# Patient Record
Sex: Male | Born: 1960
Health system: Southern US, Community
[De-identification: ages and names within clinical notes are randomized; demographics above are authoritative.]

## PROBLEM LIST (undated history)

## (undated) DIAGNOSIS — E785 Hyperlipidemia, unspecified: Secondary | ICD-10-CM

## (undated) DIAGNOSIS — K224 Dyskinesia of esophagus: Secondary | ICD-10-CM

## (undated) DIAGNOSIS — K219 Gastro-esophageal reflux disease without esophagitis: Secondary | ICD-10-CM

## (undated) DIAGNOSIS — I10 Essential (primary) hypertension: Secondary | ICD-10-CM

## (undated) DIAGNOSIS — S62502A Fracture of unspecified phalanx of left thumb, initial encounter for closed fracture: Secondary | ICD-10-CM

## (undated) DIAGNOSIS — Z87898 Personal history of other specified conditions: Secondary | ICD-10-CM

## (undated) HISTORY — DX: Dyskinesia of esophagus: K22.4

## (undated) HISTORY — DX: Gastro-esophageal reflux disease without esophagitis: K21.9

## (undated) HISTORY — DX: Fracture of unspecified phalanx of left thumb, initial encounter for closed fracture: S62.502A

## (undated) HISTORY — DX: Personal history of other specified conditions: Z87.898

## (undated) HISTORY — DX: Hyperlipidemia, unspecified: E78.5

## (undated) HISTORY — DX: Essential (primary) hypertension: I10

---

## 2004-12-02 DIAGNOSIS — S62502A Fracture of unspecified phalanx of left thumb, initial encounter for closed fracture: Secondary | ICD-10-CM

## 2004-12-02 HISTORY — DX: Fracture of unspecified phalanx of left thumb, initial encounter for closed fracture: S62.502A

## 2005-05-14 ENCOUNTER — Emergency Department (HOSPITAL_COMMUNITY): Admission: EM | Admit: 2005-05-14 | Discharge: 2005-05-15 | Payer: Self-pay | Admitting: Emergency Medicine

## 2006-06-12 ENCOUNTER — Encounter (INDEPENDENT_AMBULATORY_CARE_PROVIDER_SITE_OTHER): Payer: Self-pay | Admitting: Cardiology

## 2006-06-12 ENCOUNTER — Ambulatory Visit (HOSPITAL_COMMUNITY): Admission: RE | Admit: 2006-06-12 | Discharge: 2006-06-12 | Payer: Self-pay | Admitting: Cardiovascular Disease

## 2006-12-20 ENCOUNTER — Emergency Department (HOSPITAL_COMMUNITY): Admission: EM | Admit: 2006-12-20 | Discharge: 2006-12-20 | Payer: Self-pay | Admitting: Emergency Medicine

## 2006-12-23 ENCOUNTER — Emergency Department (HOSPITAL_COMMUNITY): Admission: EM | Admit: 2006-12-23 | Discharge: 2006-12-23 | Payer: Self-pay | Admitting: *Deleted

## 2007-04-20 ENCOUNTER — Encounter: Admission: RE | Admit: 2007-04-20 | Discharge: 2007-04-20 | Payer: Self-pay | Admitting: Gastroenterology

## 2007-05-18 ENCOUNTER — Ambulatory Visit (HOSPITAL_COMMUNITY): Admission: RE | Admit: 2007-05-18 | Discharge: 2007-05-18 | Payer: Self-pay | Admitting: Gastroenterology

## 2008-08-04 HISTORY — PX: HIATAL HERNIA REPAIR: SHX195

## 2010-11-20 ENCOUNTER — Ambulatory Visit (INDEPENDENT_AMBULATORY_CARE_PROVIDER_SITE_OTHER): Payer: 59 | Admitting: Internal Medicine

## 2010-11-20 ENCOUNTER — Encounter: Payer: Self-pay | Admitting: Internal Medicine

## 2010-11-20 VITALS — BP 156/96 | HR 78 | Temp 98.6°F | Ht 68.5 in | Wt 148.0 lb

## 2010-11-20 DIAGNOSIS — Z0000 Encounter for general adult medical examination without abnormal findings: Secondary | ICD-10-CM

## 2010-11-20 NOTE — Progress Notes (Signed)
Subjective:    Patient ID: Matthew Roberts, male    DOB: Dec 06, 1960, 50 y.o.   MRN: [id number]  HPIMr. Trudo presents to establish for on-going continuity care. He has no active complaints or problems at this time. He feels he is in good health.  Past Medical History  Diagnosis Date  . GERD (gastroesophageal reflux disease)   . History of epistaxis     required cauterization '08  . Fracture of thumb, left, closed may '06  . Esophageal dysmotility     Dx barium swallow.    Past Surgical History  Procedure Date  . Hiatal hernia repair    Family History  Problem Relation Age of Onset  . Dementia Mother   . Cancer Neg Hx   . COPD Neg Hx   . Drug abuse Neg Hx   . Diabetes Neg Hx   . Hyperlipidemia Neg Hx   . Hypertension Neg Hx   . Heart disease Neg Hx    History   Social History  . Marital Status: Single    Spouse Name: N/A    Number of Children: N/A  . Years of Education: 12   Occupational History  . floor tech [employer]   Social History Main Topics  . Smoking status: Current Everyday Smoker -- 0.5 packs/day for 30 years    Types: Cigarettes  . Smokeless tobacco: Never Used   Comment: 10 cigs per day  . Alcohol Use: 3.5 oz/week    7 drink(s) per week  . Drug Use: No  . Sexually Active: Yes -- Male partner(s)   Other Topics Concern  . Not on file   Social History Narrative   [employer]. 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 Married - '82 - 70yrs/divorced. No children. Serially monogamous. 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 No pets.         Review of Systems Review of Systems  Constitutional:  Negative for fever, chills, activity change and unexpected weight change.  HENT:  Negative for hearing loss, ear pain, congestion, neck stiffness and postnasal drip.   Eyes: Negative for pain, discharge and visual disturbance.  Respiratory: Negative for chest tightness and wheezing.   Cardiovascular: Negative for chest pain and palpitations.   [No decreased exercise tolerance Gastrointestinal: [No change in bowel habit. No bloating or gas. No reflux or indigestion Genitourinary: Negative for urgency, frequency, flank pain and difficulty urinating.  Musculoskeletal: Negative for myalgias, back pain, arthralgias and gait problem. Mild stiffness in the AM. Some shoulder pain-right Neurological: Negative for dizziness, tremors, weakness and headaches.  Hematological: Negative for adenopathy.  Psychiatric/Behavioral: Negative for behavioral problems and dysphoric mood.       Objective:   Physical Exam Constitutional: He is oriented to person, place, and time. He appears well-developed and well-nourished.       Healthy appearing AA male in no acute distress  HENT:  Head: Normocephalic and atraumatic.  Right Ear: External ear normal.  Left Ear: External ear normal.  Nose: Nose normal.  Mouth/Throat: Oropharynx is clear and moist.  Eyes: Conjunctivae and EOM are normal. Pupils are equal, round, and reactive to light. Right eye exhibits no discharge. Left eye exhibits no discharge. No scleral icterus.  Neck: Normal range of motion. Neck supple. No JVD present. No tracheal deviation present. No thyromegaly present.  Cardiovascular: Normal rate, regular rhythm and normal heart sounds.  Exam reveals no gallop and no friction rub.   No murmur heard.  Quiet precordium. 2+ radial and DP pulses  Pulmonary/Chest: Effort normal. No respiratory distress. He has no wheezes. He has no rales. He exhibits no tenderness.       No chest wall deformity  Abdominal: Soft. Bowel sounds are normal. He exhibits no distension. There is no tenderness. There is no rebound and no guarding.       No heptosplenomegaly  Musculoskeletal: Normal range of motion. He exhibits no edema and no tenderness.       Small and large joints without redness, synovial thickening or deformity. Full range of motion preserved about all small, median and large joints.    Lymphadenopathy:    He has no cervical adenopathy.  Neurological: He is alert and oriented to person, place, and time. He has normal reflexes. No cranial nerve deficit. Coordination normal.  Skin: Skin is warm and dry. No rash noted. No erythema.  Psychiatric: He has a normal mood and affect. His behavior is normal. Thought content normal.         Assessment & Plan:  1. Health Maintenance - a healthy appearing gentleman with a normal exam and normal history. He had all his immunizations when he hired on with [employer]. He is not certain as to what labs he may have had. He will check on this and report back. Appropriate labs will be ordered as indicated. He is a candidate for colorectal cancer screening and will discuss this with him at his next office visit.  He will return as needed or in 1 year.

## 2010-11-22 ENCOUNTER — Telehealth: Payer: Self-pay | Admitting: *Deleted

## 2010-11-22 DIAGNOSIS — Z0000 Encounter for general adult medical examination without abnormal findings: Secondary | ICD-10-CM

## 2010-11-22 NOTE — Telephone Encounter (Signed)
Patient requesting lab order, please advise.

## 2010-11-22 NOTE — Telephone Encounter (Signed)
Was going to checlk on whether he had labs. Guess no - order cpx labs

## 2010-11-26 NOTE — Telephone Encounter (Signed)
Please give specifics - needs tests and dx, thanks

## 2010-11-27 NOTE — Telephone Encounter (Signed)
Orders entered

## 2010-11-27 NOTE — Telephone Encounter (Signed)
Left vm for pt

## 2010-12-20 NOTE — Consult Note (Signed)
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  Patient received  tetanus prophylaxis and 1 g of Kefzol upon arrival to the emergency room.   PAST MEDICAL HISTORY:  Essentially unremarkable.   MEDICATIONS:  None.   ALLERGIES:  None   PAST SURGICAL HISTORY:  Negative.   REVIEW OF SYSTEMS:  Negative for other injuries.  Negative for loss of  consciousness.   OBJECTIVE EXAMINATION:  GENERAL:  Patient has a small abrasion over his  forehead.  He denies any loss of consciousness.  Denies any amnesia of the  event.  EXTREMITIES:  His thumb has been digitally blocked and prior to digital  block patient did have sensation over the tip of the thumb.  Examination  this time patient has brisk capillary refill under the nail bed less than 1  second capillary refill.  He does have a laceration over the radial border  of the thumb just proximal to the IP joint.  Patient was underwent a further  digital block and the thumb was prepped and draped into a sterile field.  Visualization of the wound showed an intact flexor tendon.  Neurovascularly  his thumb is intact.  He did have an open fracture and this was irrigated  with Betadine and saline.  The wound was closed which was 1 cm in length  with 4-0 nylon.   Radiograph shows an oblique fracture of the proximal phalanx just proximal  to the IP joint extra-articular with approximately 3 mm of dorsal  displacement and neutral varus  and valgus alignment.   ASSESSMENT:  Open fracture proximal phalanx left thumb.   PLAN:  After informed consent and sterile prepping patient underwent further  digital block.  The wound was irrigated, cleansed.  The wound was closed  using 4-0 nylon with loose closure.  Wound was covered with Adaptic  orthopedic sponges, sterile Kerlix, and a Coban dressing.  Patient was given  a prescription for Vicodin and Keflex.  Patient to call the office tomorrow.  Follow up in the office on Friday for evaluation for open reduction internal  fixation of the proximal phalanx.      Nadara Mustard, MD  Electronically Signed     MVD/MEDQ  D:  05/15/2005  T:  05/15/2005  Job:  [id number]

## 2011-02-24 ENCOUNTER — Other Ambulatory Visit (INDEPENDENT_AMBULATORY_CARE_PROVIDER_SITE_OTHER): Payer: 59

## 2011-02-24 ENCOUNTER — Other Ambulatory Visit: Payer: Self-pay | Admitting: Internal Medicine

## 2011-02-24 DIAGNOSIS — Z0000 Encounter for general adult medical examination without abnormal findings: Secondary | ICD-10-CM

## 2011-02-24 LAB — COMPREHENSIVE METABOLIC PANEL
AST: 21 U/L (ref 0–37)
Albumin: 4.2 g/dL (ref 3.5–5.2)
Alkaline Phosphatase: 57 U/L (ref 39–117)
BUN: 15 mg/dL (ref 6–23)
Calcium: 8.7 mg/dL (ref 8.4–10.5)
Chloride: 105 mEq/L (ref 96–112)
Glucose, Bld: 151 mg/dL — ABNORMAL HIGH (ref 70–99)
Potassium: 3.3 mEq/L — ABNORMAL LOW (ref 3.5–5.1)
Sodium: 135 mEq/L (ref 135–145)
Total Bilirubin: 0.6 mg/dL (ref 0.3–1.2)
Total Protein: 7.6 g/dL (ref 6.0–8.3)

## 2011-02-24 LAB — LIPID PANEL
Cholesterol: 215 mg/dL — ABNORMAL HIGH (ref 0–200)
HDL: 47.3 mg/dL (ref >39.00)
Total CHOL/HDL Ratio: 5
VLDL: 30 mg/dL (ref 0.0–40.0)

## 2011-02-24 LAB — HEPATIC FUNCTION PANEL
ALT: 16 U/L (ref 0–53)
AST: 21 U/L (ref 0–37)
Albumin: 4.2 g/dL (ref 3.5–5.2)
Bilirubin, Direct: 0.1 mg/dL (ref 0.0–0.3)
Total Bilirubin: 0.6 mg/dL (ref 0.3–1.2)
Total Protein: 7.6 g/dL (ref 6.0–8.3)

## 2011-02-24 LAB — LDL CHOLESTEROL, DIRECT: Direct LDL: 161.2 mg/dL

## 2011-02-24 LAB — CBC WITH DIFFERENTIAL/PLATELET
Basophils Absolute: 0.1 10*3/uL (ref 0.0–0.1)
Basophils Relative: 1.6 % (ref 0.0–3.0)
Eosinophils Absolute: 0.1 10*3/uL (ref 0.0–0.7)
Eosinophils Relative: 1.4 % (ref 0.0–5.0)
HCT: 40.8 % (ref 39.0–52.0)
Hemoglobin: 13.7 g/dL (ref 13.0–17.0)
Lymphocytes Relative: 41.1 % (ref 12.0–46.0)
Lymphs Abs: 3.4 10*3/uL (ref 0.7–4.0)
MCHC: 33.6 g/dL (ref 30.0–36.0)
MCV: 93.9 fl (ref 78.0–100.0)
Monocytes Absolute: 0.5 10*3/uL (ref 0.1–1.0)
Monocytes Relative: 6.5 % (ref 3.0–12.0)
Neutro Abs: 4.1 10*3/uL (ref 1.4–7.7)
Neutrophils Relative %: 49.4 % (ref 43.0–77.0)
RBC: 4.35 Mil/uL (ref 4.22–5.81)
RDW: 15 % — ABNORMAL HIGH (ref 11.5–14.6)
WBC: 8.2 10*3/uL (ref 4.5–10.5)

## 2011-02-26 ENCOUNTER — Encounter: Payer: Self-pay | Admitting: Internal Medicine

## 2011-02-28 ENCOUNTER — Telehealth: Payer: Self-pay | Admitting: *Deleted

## 2011-02-28 NOTE — Telephone Encounter (Signed)
Pt left vm req results of labs. Letter mailed, no answer, left vm for pt to look for letter and call office to schedule apt.

## 2011-03-10 ENCOUNTER — Ambulatory Visit: Payer: 59 | Admitting: Internal Medicine

## 2011-03-12 ENCOUNTER — Ambulatory Visit (INDEPENDENT_AMBULATORY_CARE_PROVIDER_SITE_OTHER): Payer: 59 | Admitting: Internal Medicine

## 2011-03-12 ENCOUNTER — Encounter: Payer: Self-pay | Admitting: Internal Medicine

## 2011-03-12 VITALS — BP 130/88 | HR 83 | Temp 98.0°F | Wt 149.0 lb

## 2011-03-12 DIAGNOSIS — R7989 Other specified abnormal findings of blood chemistry: Secondary | ICD-10-CM

## 2011-03-13 DIAGNOSIS — R7989 Other specified abnormal findings of blood chemistry: Secondary | ICD-10-CM | POA: Insufficient documentation

## 2011-03-13 NOTE — Progress Notes (Signed)
  Subjective:    Patient ID: Matthew Roberts, male    DOB: 11/18/1960, 50 y.o.   MRN: [id number]  HPI Matthew Roberts was seen as a new patient in April '12. He finally returned for lab work which revealed he had an elevated LDL and an elevated serum glucose. He presents today to discuss these results. He has been doing well in the interval with no new complaints or problems.  I have reviewed the patient's medical history in detail and updated the computerized patient record.    Review of Systems System review is negative for any constitutional, cardiac, pulmonary, GI or neuro symptoms or complaints     Objective:   Physical Exam Vitals normal Gen'l - WNWD AA man in no distress HEENT - grossly normal Chest- no increased work of breathing or wheezing Cor - 2+ radial pulse.       Assessment & Plan:

## 2011-03-13 NOTE — Assessment & Plan Note (Signed)
Lab work July 23rd revealed a serum glucose of 151 fasting and an LDL 161. He has no prior h/o diabetes or hyperlipidemia. He has been feeling well.   Plan - lifestyle management: no sugar low carb diet and low fat diet           Regular aerobic exercise           Referred to [email or web address] for additional education on diet          Follow-up lab in 3 months

## 2012-05-25 ENCOUNTER — Encounter: Payer: 59 | Admitting: Internal Medicine

## 2012-05-27 ENCOUNTER — Other Ambulatory Visit (INDEPENDENT_AMBULATORY_CARE_PROVIDER_SITE_OTHER): Payer: 59

## 2012-05-27 ENCOUNTER — Encounter: Payer: Self-pay | Admitting: Internal Medicine

## 2012-05-27 ENCOUNTER — Ambulatory Visit (INDEPENDENT_AMBULATORY_CARE_PROVIDER_SITE_OTHER): Payer: 59 | Admitting: Internal Medicine

## 2012-05-27 VITALS — BP 132/88 | HR 85 | Temp 97.2°F | Resp 16 | Wt 146.0 lb

## 2012-05-27 DIAGNOSIS — R739 Hyperglycemia, unspecified: Secondary | ICD-10-CM

## 2012-05-27 DIAGNOSIS — Z125 Encounter for screening for malignant neoplasm of prostate: Secondary | ICD-10-CM

## 2012-05-27 DIAGNOSIS — F17210 Nicotine dependence, cigarettes, uncomplicated: Secondary | ICD-10-CM | POA: Insufficient documentation

## 2012-05-27 DIAGNOSIS — E785 Hyperlipidemia, unspecified: Secondary | ICD-10-CM

## 2012-05-27 DIAGNOSIS — F17290 Nicotine dependence, other tobacco product, uncomplicated: Secondary | ICD-10-CM

## 2012-05-27 DIAGNOSIS — Z0000 Encounter for general adult medical examination without abnormal findings: Secondary | ICD-10-CM

## 2012-05-27 DIAGNOSIS — R1310 Dysphagia, unspecified: Secondary | ICD-10-CM

## 2012-05-27 DIAGNOSIS — R7309 Other abnormal glucose: Secondary | ICD-10-CM

## 2012-05-27 LAB — COMPREHENSIVE METABOLIC PANEL
ALT: 18 U/L (ref 0–53)
Alkaline Phosphatase: 47 U/L (ref 39–117)
BUN: 11 mg/dL (ref 6–23)
CO2: 27 mEq/L (ref 19–32)
Calcium: 9.6 mg/dL (ref 8.4–10.5)
Chloride: 107 mEq/L (ref 96–112)
Creatinine, Ser: 0.8 mg/dL (ref 0.4–1.5)
GFR: 133.6 mL/min (ref >60.00)
Glucose, Bld: 100 mg/dL — ABNORMAL HIGH (ref 70–99)
Potassium: 4.6 mEq/L (ref 3.5–5.1)
Sodium: 141 mEq/L (ref 135–145)

## 2012-05-27 LAB — LIPID PANEL
Cholesterol: 215 mg/dL — ABNORMAL HIGH (ref 0–200)
Total CHOL/HDL Ratio: 5
Triglycerides: 49 mg/dL (ref 0.0–149.0)
VLDL: 9.8 mg/dL (ref 0.0–40.0)

## 2012-05-27 LAB — HEMOGLOBIN A1C: Hgb A1c MFr Bld: 6.3 % (ref 4.6–6.5)

## 2012-05-27 MED ORDER — ESOMEPRAZOLE MAGNESIUM 40 MG PO CPDR: 40.0000 mg | DELAYED_RELEASE_CAPSULE | Freq: Every day | ORAL | Status: DC

## 2012-05-27 NOTE — Assessment & Plan Note (Signed)
Discussed importance of controlling lipids  Plan -  Repeat lipid panel with recommedations to follow - for LDL 160+ medical therapy

## 2012-05-27 NOTE — Assessment & Plan Note (Signed)
Patient with reflux and recurrent problems with swallow.  Plan Refer to Dr. Roderic Scarce, previous GI doctor, for consultation and evaluation  Refer for routine colonoscopy as well  Nexium 40 mg daily

## 2012-05-27 NOTE — Patient Instructions (Addendum)
Swallow problem - sounds like acid reflux plus a problem with how your esophagus works Plan Start nexium 40 mg once a day in the AM  Referral to Dr. Bosie Clos for gastroenterology - you have seen him before.  Health maintenance - check with employee health to get an immunization record to bring to the office so we can put it in your record  You will have lab today - result to follow by mail or by Mychart  You will need to talk with Dr. Bosie Clos about having a colonoscopy to screen for coloncancer  Smoking Cessation - YOU MUST STOP SMOKING. Sign up for a [facility] [employer] cessation class.  Arm pain - lateral epicondylitis. Plan Rub of choice  May want to use a forearm band  Lateral Epicondylitis (Tennis Elbow) with Rehab Lateral epicondylitis involves inflammation and pain around the outer portion of the elbow. The pain is caused by inflammation of the tendons in the forearm that bring back (extend) the wrist. Lateral epicondylittis is also called tennis elbow, because it is very common in tennis players. However, it may occur in any individual who extends the wrist repetitively. If lateral epicondylitis is left untreated, it may become a chronic problem. SYMPTOMS    Pain, tenderness, and inflammation on the outer (lateral) side of the elbow.   Pain or weakness with gripping activities.   Pain that increases with wrist twisting motions (playing tennis, using a screwdriver, opening a door or a jar).   Pain with lifting objects, including a coffee cup.  CAUSES   Lateral epicondylitis is caused by inflammation of the tendons that extend the wrist. Causes of injury may include:  Repetitive stress and strain on the muscles and tendons that extend the wrist.   Sudden change in activity level or intensity.   Incorrect grip in racquet sports.   Incorrect grip size of racquet (often too large).   Incorrect hitting position or technique (usually backhand, leading with the elbow).   Using a  racket that is too heavy.  RISK INCREASES WITH:  Sports or occupations that require repetitive and/or strenuous forearm and wrist movements (tennis, squash, racquetball, carpentry).   Poor wrist and forearm strength and flexibility.   Failure to warm up properly before activity.   Resuming activity before healing, rehabilitation, and conditioning are complete.  PREVENTION    Warm up and stretch properly before activity.   Maintain physical fitness:   Strength, flexibility, and endurance.   Cardiovascular fitness.   Wear and use properly fitted equipment.   Learn and use proper technique and have a coach correct improper technique.   Wear a tennis elbow (counterforce) brace.  PROGNOSIS   The course of this condition depends on the degree of the injury. If treated properly, acute cases (symptoms lasting less than 4 weeks) are often resolved in 2 to 6 weeks. Chronic (longer lasting cases) often resolve in 3 to 6 months, but may require physical therapy. RELATED COMPLICATIONS    Frequently recurring symptoms, resulting in a chronic problem. Properly treating the problem the first time decreases frequency of recurrence.   Chronic inflammation, scarring tendon degeneration, and partial tendon tear, requiring surgery.   Delayed healing or resolution of symptoms.  TREATMENT   Treatment first involves the use of ice and medicine, to reduce pain and inflammation. Strengthening and stretching exercises may help reduce discomfort, if performed regularly. These exercises may be performed at home, if the condition is an acute injury. Chronic cases may require a referral to  a physical therapist for evaluation and treatment. Your caregiver may advise a corticosteroid injection, to help reduce inflammation. Rarely, surgery is needed. MEDICATION  If pain medicine is needed, nonsteroidal anti-inflammatory medicines (aspirin and ibuprofen), or other minor pain relievers (acetaminophen), are often  advised.   Do not take pain medicine for 7 days before surgery.   Prescription pain relievers may be given, if your caregiver thinks they are needed. Use only as directed and only as much as you need.   Corticosteroid injections may be recommended. These injections should be reserved only for the most severe cases, because they can only be given a certain number of times.  HEAT AND COLD  Cold treatment (icing) should be applied for 10 to 15 minutes every 2 to 3 hours for inflammation and pain, and immediately after activity that aggravates your symptoms. Use ice packs or an ice massage.   Heat treatment may be used before performing stretching and strengthening activities prescribed by your caregiver, physical therapist, or athletic trainer. Use a heat pack or a warm water soak.  SEEK MEDICAL CARE IF: Symptoms get worse or do not improve in 2 weeks, despite treatment. EXERCISES   RANGE OF MOTION (ROM) AND STRETCHING EXERCISES - Epicondylitis, Lateral (Tennis Elbow) These exercises may help you when beginning to rehabilitate your injury. Your symptoms may go away with or without further involvement from your physician, physical therapist or athletic trainer. While completing these exercises, remember:    Restoring tissue flexibility helps normal motion to return to the joints. This allows healthier, less painful movement and activity.   An effective stretch should be held for at least 30 seconds.   A stretch should never be painful. You should only feel a gentle lengthening or release in the stretched tissue.  RANGE OF MOTION  Wrist Flexion, Active-Assisted  Extend your right / left elbow with your fingers pointing down.*   Gently pull the back of your hand towards you, until you feel a gentle stretch on the top of your forearm.   Hold this position for __________ seconds.  Repeat __________ times. Complete this exercise __________ times per day.   *If directed by your physician,  physical therapist or athletic trainer, complete this stretch with your elbow bent, rather than extended. RANGE OF MOTION  Wrist Extension, Active-Assisted  Extend your right / left elbow and turn your palm upwards.*   Gently pull your palm and fingertips back, so your wrist extends and your fingers point more toward the ground.   You should feel a gentle stretch on the inside of your forearm.   Hold this position for __________ seconds.  Repeat __________ times. Complete this exercise __________ times per day. *If directed by your physician, physical therapist or athletic trainer, complete this stretch with your elbow bent, rather than extended. STRETCH - Wrist Flexion  Place the back of your right / left hand on a tabletop, leaving your elbow slightly bent. Your fingers should point away from your body.   Gently press the back of your hand down onto the table by straightening your elbow. You should feel a stretch on the top of your forearm.   Hold this position for __________ seconds.  Repeat __________ times. Complete this stretch __________ times per day.   STRETCH  Wrist Extension   Place your right / left fingertips on a tabletop, leaving your elbow slightly bent. Your fingers should point backwards.   Gently press your fingers and palm down onto the table by  straightening your elbow. You should feel a stretch on the inside of your forearm.   Hold this position for __________ seconds.  Repeat __________ times. Complete this stretch __________ times per day.   STRENGTHENING EXERCISES - Epicondylitis, Lateral (Tennis Elbow) These exercises may help you when beginning to rehabilitate your injury. They may resolve your symptoms with or without further involvement from your physician, physical therapist or athletic trainer. While completing these exercises, remember:    Muscles can gain both the endurance and the strength needed for everyday activities through controlled exercises.     Complete these exercises as instructed by your physician, physical therapist or athletic trainer. Increase the resistance and repetitions only as guided.   You may experience muscle soreness or fatigue, but the pain or discomfort you are trying to eliminate should never worsen during these exercises. If this pain does get worse, stop and make sure you are following the directions exactly. If the pain is still present after adjustments, discontinue the exercise until you can discuss the trouble with your caregiver.  STRENGTH Wrist Flexors  Sit with your right / left forearm palm-up and fully supported on a table or countertop. Your elbow should be resting below the height of your shoulder. Allow your wrist to extend over the edge of the surface.   Loosely holding a __________ weight, or a piece of rubber exercise band or tubing, slowly curl your hand up toward your forearm.   Hold this position for __________ seconds. Slowly lower the wrist back to the starting position in a controlled manner.  Repeat __________ times. Complete this exercise __________ times per day.   STRENGTH  Wrist Extensors  Sit with your right / left forearm palm-down and fully supported on a table or countertop. Your elbow should be resting below the height of your shoulder. Allow your wrist to extend over the edge of the surface.   Loosely holding a __________ weight, or a piece of rubber exercise band or tubing, slowly curl your hand up toward your forearm.   Hold this position for __________ seconds. Slowly lower the wrist back to the starting position in a controlled manner.  Repeat __________ times. Complete this exercise __________ times per day.   STRENGTH - Ulnar Deviators  Stand with a ____________________ weight in your right / left hand, or sit while holding a rubber exercise band or tubing, with your healthy arm supported on a table or countertop.   Move your wrist, so that your pinkie travels toward your  forearm and your thumb moves away from your forearm.   Hold this position for __________ seconds and then slowly lower the wrist back to the starting position.  Repeat __________ times. Complete this exercise __________ times per day STRENGTH - Radial Deviators  Stand with a ____________________ weight in your right / left hand, or sit while holding a rubber exercise band or tubing, with your injured arm supported on a table or countertop.   Raise your hand upward in front of you or pull up on the rubber tubing.   Hold this position for __________ seconds and then slowly lower the wrist back to the starting position.  Repeat __________ times. Complete this exercise __________ times per day. STRENGTH  Forearm Supinators   Sit with your right / left forearm supported on a table, keeping your elbow below shoulder height. Rest your hand over the edge, palm down.   Gently grip a hammer or a soup ladle.   Without moving your  elbow, slowly turn your palm and hand upward to a "thumbs-up" position.   Hold this position for __________ seconds. Slowly return to the starting position.  Repeat __________ times. Complete this exercise __________ times per day.   STRENGTH  Forearm Pronators   Sit with your right / left forearm supported on a table, keeping your elbow below shoulder height. Rest your hand over the edge, palm up.   Gently grip a hammer or a soup ladle.   Without moving your elbow, slowly turn your palm and hand upward to a "thumbs-up" position.   Hold this position for __________ seconds. Slowly return to the starting position.  Repeat __________ times. Complete this exercise __________ times per day.   STRENGTH - Grip  Grasp a tennis ball, a dense sponge, or a large, rolled sock in your hand.   Squeeze as hard as you can, without increasing any pain.   Hold this position for __________ seconds. Release your grip slowly.  Repeat __________ times. Complete this exercise  __________ times per day.   STRENGTH - Elbow Extensors, Isometric  Stand or sit upright, on a firm surface. Place your right / left arm so that your palm faces your stomach, and it is at the height of your waist.   Place your opposite hand on the underside of your forearm. Gently push up as your right / left arm resists. Push as hard as you can with both arms, without causing any pain or movement at your right / left elbow. Hold this stationary position for __________ seconds.  Gradually release the tension in both arms. Allow your muscles to relax completely before repeating. Document Released: 07/21/2005 Document Revised: 10/13/2011 Document Reviewed: 11/02/2008 [facility] Patient Information 2013 [place], [place].

## 2012-05-27 NOTE — Assessment & Plan Note (Signed)
Patient with previously elevated serum glucose. He has been on no treatment although he does report a fairly healthy diet.  Plan Repeat lab including A1C with recommendations to follow.

## 2012-05-27 NOTE — Assessment & Plan Note (Addendum)
Discussed the need and health benefits of tobacco cessation. He is motivated to quit.  Plan  he is encouraged to contact employee health at [facility] re: smoking cessation class.  CXR: pending

## 2012-05-27 NOTE — Assessment & Plan Note (Signed)
Interval history significant for reflux, dysphagia and continued smoking. Physical exam is normal. He is encouraged to have screening colonoscopy, to stop smoking. Immunizations - he is instructed to obtain immunization record from [facility] employee health.   In summary - a nice man who needs to stop smoking and needs routine care including colonoscopy

## 2012-05-27 NOTE — Progress Notes (Signed)
Subjective:    Patient ID: Matthew Roberts, male    DOB: 1961/07/21, 51 y.o.   MRN: [id number]  HPI Mr. Baines presents for routine wellness exam. He does c/o tingling/stinging discomfort to the left elbow. He does not recall any injury, has not seen a rash. He does have discomfort if he lifts something the wrong way. He has not found any treatment that helps. He is not limited in his activities in any.   He does report difficulty swallowing: he has to "push down food" but he has not choked or had to regurgitate. He continues to have reflux for which he takes no medication. He has had esophageal stricture in the past with dilation. Chart reviewed: he did have a UGI/BaS in 2008 with severe dysmotility. He did then have surgical correction of hiatal hernia in [place] in '09  (no records in [place])  He is otherwise doing OK. He has not been to the dentist in the last year. He has seen the eye doctor. He reports a healthy diet. He does not have a regular exercise program. He has not had a colonosocpy. He is not sure if he has had Tdap in the last 5 years. 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 He wants to quit smoking but has not participated in [facility] smoking cessation program and is instructed to do this.   Past Medical History  Diagnosis Date  . GERD (gastroesophageal reflux disease)   . History of epistaxis     required cauterization '08  . Fracture of thumb, left, closed may '06  . Esophageal dysmotility     Dx barium swallow.    Past Surgical History  Procedure Date  . Hiatal hernia repair 2010   Family History  Problem Relation Age of Onset  . Dementia Mother   . Cancer Neg Hx   . COPD Neg Hx   . Drug abuse Neg Hx   . Diabetes Neg Hx   . Hyperlipidemia Neg Hx   . Hypertension Neg Hx   . Heart disease Neg Hx    History   Social History  . Marital Status: Single      Spouse Name: N/A    Number of Children: N/A  . Years of Education: 12   Occupational History  . floor tech [employer]   Social History Main Topics  . Smoking status: Current Every Day Smoker -- 0.5 packs/day for 30 years    Types: Cigarettes  . Smokeless tobacco: Never Used   Comment: 10 cigs per day  . Alcohol Use: 3.5 oz/week    7 drink(s) per week  . Drug Use: No  . Sexually Active: Yes -- Male partner(s)   Other Topics Concern  . Not on file   Social History Narrative   HSG. 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 Married - '82 - 30yrs/divorced. No children. Serially monogamous. 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 No pets.     No current outpatient prescriptions on file prior to visit.      Review of Systems Constitutional:  Negative for fever, chills, activity change and unexpected weight change.  HEENT:  Negative for hearing loss, ear pain, congestion, neck stiffness and postnasal drip. Negative for sore throat or swallowing problems. Negative for dental complaints.   Eyes: Negative for vision loss or change in visual acuity.  Respiratory: Negative for chest tightness and wheezing. Negative for DOE.   Cardiovascular: Negative for chest pain or palpitations. No decreased exercise tolerance Gastrointestinal: No change in bowel habit. No bloating or gas. Positive for reflux. Genitourinary: Negative for urgency, frequency, flank pain and difficulty urinating.  Musculoskeletal: Negative for myalgias, back pain, arthralgias and gait problem.  Neurological: Negative for dizziness, tremors, weakness and headaches.  Hematological: Negative for adenopathy.  Psychiatric/Behavioral: Negative for behavioral problems and dysphoric mood.       Objective:   Physical Exam Filed Vitals:   05/27/12 0905  BP: 132/88  Pulse: 85  Temp: 97.2 F (36.2 C)  Resp: 16   Wt Readings from Last 3 Encounters:  05/27/12 146 lb (66.225 kg)  03/12/11 149 lb (67.586 kg)   11/20/10 148 lb (67.132 kg)   Gen'l: Well nourished well developed,slender AA male in no acute distress  HEENT: Head: Normocephalic and atraumatic. Right Ear: External ear normal. EAC/TM nl. Left Ear: External ear normal.  EAC/TM nl. Nose: Nose normal. Mouth/Throat: Oropharynx is clear and moist. Dentition - native, in good repair. No buccal or palatal lesions. Posterior pharynx clear. Eyes: Conjunctivae and sclera clear. EOM intact. Pupils are equal, round, and reactive to light. Right eye exhibits no discharge. Left eye exhibits no discharge. Neck: Normal range of motion. Neck supple. No JVD present. No tracheal deviation present. No thyromegaly present.  Cardiovascular: Normal rate, regular rhythm, no gallop, no friction rub, no murmur heard.      Quiet precordium. 2+ radial and DP pulses . No carotid bruits Pulmonary/Chest: Effort normal. No respiratory distress or increased WOB, no wheezes, no rales. No chest wall deformity or CVAT. Abdomen: Soft. Bowel sounds are normal in all quadrants. He exhibits no distension, no tenderness, no rebound or guarding, No heptosplenomegaly  Genitourinary: Rectal: NST, normal prostate Musculoskeletal: Normal range of motion. He exhibits no edema and no tenderness.       Small and large joints without redness, synovial thickening or deformity. Full range of motion preserved about all small, median and large joints.  Lymphadenopathy:    He has no cervical or supraclavicular adenopathy.  Neurological: He is alert and oriented to person, place, and time. CN II-XII intact. DTRs 2+ and symmetrical biceps, radial and patellar tendons. Cerebellar function normal with no tremor, rigidity, normal gait and station.  Skin: Skin is warm and dry. No rash noted. No erythema.  Psychiatric: He has a normal mood and affect. His behavior is normal. Thought content normal.             Assessment & Plan:

## 2012-05-28 LAB — LDL CHOLESTEROL, DIRECT: Direct LDL: 162.5 mg/dL

## 2012-05-29 ENCOUNTER — Encounter: Payer: Self-pay | Admitting: Internal Medicine

## 2012-05-31 ENCOUNTER — Other Ambulatory Visit: Payer: Self-pay | Admitting: Internal Medicine

## 2012-05-31 DIAGNOSIS — Z1211 Encounter for screening for malignant neoplasm of colon: Secondary | ICD-10-CM

## 2012-06-01 ENCOUNTER — Encounter: Payer: Self-pay | Admitting: Internal Medicine

## 2012-06-15 ENCOUNTER — Ambulatory Visit (INDEPENDENT_AMBULATORY_CARE_PROVIDER_SITE_OTHER): Payer: 59 | Admitting: Internal Medicine

## 2012-06-15 ENCOUNTER — Encounter: Payer: Self-pay | Admitting: Internal Medicine

## 2012-06-15 VITALS — BP 154/100 | HR 92 | Temp 98.7°F | Resp 16 | Wt 147.0 lb

## 2012-06-15 DIAGNOSIS — R030 Elevated blood-pressure reading, without diagnosis of hypertension: Secondary | ICD-10-CM

## 2012-06-15 DIAGNOSIS — I10 Essential (primary) hypertension: Secondary | ICD-10-CM | POA: Insufficient documentation

## 2012-06-15 DIAGNOSIS — E785 Hyperlipidemia, unspecified: Secondary | ICD-10-CM

## 2012-06-15 MED ORDER — LOVASTATIN 20 MG PO TABS: 20.0000 mg | ORAL_TABLET | Freq: Every day | ORAL | Status: DC

## 2012-06-15 NOTE — Patient Instructions (Addendum)
High cholesterol - for two years in a row the LDL has been at or just above 160, the treatment threshold. 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  Plan Start lovastatin 20 mg once a day  Follow up lab in 4 weeks to assess effect and to monitor liver functions   Blood pressure - elevated today and there was an elevation about a year ago.   Plan Home or work monitoring of blood pressure - multiple random readings. If the SBP is 140+, DBP 100+ on a regular basis will need to consider medication.

## 2012-06-15 NOTE — Assessment & Plan Note (Signed)
High cholesterol - for two years in a row the LDL has been at or just above 160, the treatment threshold. 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  Plan Start lovastatin 20 mg once a day  Follow up lab in 4 weeks to assess effect and to monitor liver functions

## 2012-06-15 NOTE — Assessment & Plan Note (Signed)
Blood pressure - elevated today and there was an elevation about a year ago.   Plan Home or work monitoring of blood pressure - multiple random readings. If the SBP is 140+, DBP 100+ on a regular basis will need to consider medication.

## 2012-06-15 NOTE — Progress Notes (Signed)
  Subjective:    Patient ID: Matthew Roberts, male    DOB: 22-May-1961, 50 y.o.   MRN: [id number]  HPI Matthew Roberts returns to discuss abnormal lipid panel - elevated LDL > 160. He has not been previously treated with medication. No family h/o hyperlipidemia to his knowledge.  BP is mildly elevated at today's visit.  PMH, FamHx and SocHx reviewed for any changes and relevance. Current Outpatient Prescriptions on File Prior to Visit  Medication Sig Dispense Refill  . esomeprazole (NEXIUM) 40 MG capsule Take 1 capsule (40 mg total) by mouth daily.  30 capsule  3  . lovastatin (MEVACOR) 20 MG tablet Take 1 tablet (20 mg total) by mouth at bedtime.  30 tablet  3      Review of Systems System review is negative for any constitutional, cardiac, pulmonary, GI or neuro symptoms or complaints other than as described in the HPI.     Objective:   Physical Exam        Assessment & Plan:

## 2012-07-26 ENCOUNTER — Emergency Department (HOSPITAL_COMMUNITY)
Admission: EM | Admit: 2012-07-26 | Discharge: 2012-07-26 | Disposition: A | Discharge status: Home / Self Care | Payer: 59 | Source: Home / Self Care | Attending: Emergency Medicine | Admitting: Emergency Medicine

## 2012-07-26 ENCOUNTER — Encounter (HOSPITAL_COMMUNITY): Payer: Self-pay | Admitting: Emergency Medicine

## 2012-07-26 DIAGNOSIS — R040 Epistaxis: Secondary | ICD-10-CM

## 2012-07-26 NOTE — ED Provider Notes (Signed)
History     CSN: [id number]  Arrival date & time 07/26/12  1019   First MD Initiated Contact with Patient 07/26/12 1033      Chief Complaint  Patient presents with  . Epistaxis    (Consider location/radiation/quality/duration/timing/severity/associated sxs/prior treatment) HPI Comments: Patient describes a Saturday started with some spontaneous and repetitive nosebleeds mainly out of his right nostril. He proceeds to pinch his nose and they stop. This morning after he woke up he had a episode of nosebleeding so he decided to come in to have it checked. He denies blowing or sneezing or picking at his nose he does relate that he has had other bleedings in the past. This recent episodes have been easily controlled with only pinching his nose for several minutes. This last episode lasted several minutes this morning.  Patient is a 51 y.o. male presenting with nosebleeds. The history is provided by the patient and the spouse.  Epistaxis  This is a recurrent problem. The current episode started 2 days ago. The problem occurs every several days. The problem has been resolved. The bleeding has been from the right nare. He has tried applying pressure for the symptoms. The treatment provided significant relief. His past medical history is significant for colds and frequent nosebleeds. His past medical history does not include bleeding disorder.    Past Medical History  Diagnosis Date  . GERD (gastroesophageal reflux disease)   . History of epistaxis     required cauterization '08  . Fracture of thumb, left, closed may '06  . Esophageal dysmotility     Dx barium swallow.     Past Surgical History  Procedure Date  . Hiatal hernia repair 2010    Family History  Problem Relation Age of Onset  . Dementia Mother   . Cancer Neg Hx   . COPD Neg Hx   . Drug abuse Neg Hx   . Diabetes Neg Hx   . Hyperlipidemia Neg Hx   . Hypertension Neg Hx   . Heart disease Neg Hx     History    Substance Use Topics  . Smoking status: Current Every Day Smoker -- 0.5 packs/day for 30 years    Types: Cigarettes  . Smokeless tobacco: Never Used     Comment: 10 cigs per day  . Alcohol Use: 3.5 oz/week    7 drink(s) per week      Review of Systems  Constitutional: Negative for diaphoresis and fatigue.  HENT: Positive for nosebleeds. Negative for congestion, rhinorrhea, sneezing and postnasal drip.   Respiratory: Negative for shortness of breath.   Skin: Negative for color change, pallor and rash.  Neurological: Negative for dizziness.    Allergies  Review of patient's allergies indicates no known allergies.  Home Medications   Current Outpatient Rx  Name  Route  Sig  Dispense  Refill  . ESOMEPRAZOLE MAGNESIUM 40 MG PO CPDR   Oral   Take 1 capsule (40 mg total) by mouth daily.   30 capsule   3   . LOVASTATIN 20 MG PO TABS   Oral   Take 1 tablet (20 mg total) by mouth at bedtime.   30 tablet   3     BP 132/79  Pulse 83  Temp 98.5 F (36.9 C) (Oral)  Resp 16  SpO2 98%  Physical Exam  Nursing note and vitals reviewed. Constitutional: He is oriented to person, place, and time. He appears well-developed and well-nourished. No distress.  HENT:  Head: Normocephalic and atraumatic.  Nose: Nose normal. No mucosal edema, rhinorrhea, nose lacerations or sinus tenderness.    Mouth/Throat: No oropharyngeal exudate.  Eyes: Conjunctivae normal are normal.  Neck: Neck supple.  Lymphadenopathy:    He has no cervical adenopathy.  Neurological: He is alert and oriented to person, place, and time.  Skin: No rash noted. No erythema.    ED Course  Procedures (including critical care time)  Labs Reviewed - No data to display No results found.   1. Mild epistaxis       MDM  Resolved epistaxis. We discussed techniques for management at home and to increase his intranasal moisture. Patient agrees with treatment plan and we discussed what symptoms or severity of  bleeding will require immediate attention in the emergency department as to suggest a posterior nasal bleeding. Both patient and wife agreed and understood instructions.      a  Jimmie Molly, MD 07/26/12 1058

## 2012-07-26 NOTE — ED Notes (Signed)
Reports nose bleeds started on Saturday.  Notices its when patient wakes up in the morning.  Heat is on but since the weather has been good the heat has not click on.  Patient did open a window last night and let some air into the room all night last night.  Patient did have a nose bleed this morning also.

## 2012-09-18 ENCOUNTER — Other Ambulatory Visit: Payer: Self-pay

## 2013-02-03 ENCOUNTER — Encounter (HOSPITAL_COMMUNITY): Payer: Self-pay | Admitting: *Deleted

## 2013-02-03 DIAGNOSIS — F17200 Nicotine dependence, unspecified, uncomplicated: Secondary | ICD-10-CM | POA: Insufficient documentation

## 2013-02-03 DIAGNOSIS — R1310 Dysphagia, unspecified: Secondary | ICD-10-CM | POA: Insufficient documentation

## 2013-02-03 DIAGNOSIS — Z7982 Long term (current) use of aspirin: Secondary | ICD-10-CM | POA: Insufficient documentation

## 2013-02-03 DIAGNOSIS — Z8781 Personal history of (healed) traumatic fracture: Secondary | ICD-10-CM | POA: Insufficient documentation

## 2013-02-03 DIAGNOSIS — Z8719 Personal history of other diseases of the digestive system: Secondary | ICD-10-CM | POA: Insufficient documentation

## 2013-02-03 DIAGNOSIS — R030 Elevated blood-pressure reading, without diagnosis of hypertension: Secondary | ICD-10-CM | POA: Insufficient documentation

## 2013-02-03 DIAGNOSIS — Z79899 Other long term (current) drug therapy: Secondary | ICD-10-CM | POA: Insufficient documentation

## 2013-02-03 DIAGNOSIS — K219 Gastro-esophageal reflux disease without esophagitis: Secondary | ICD-10-CM | POA: Insufficient documentation

## 2013-02-03 NOTE — ED Notes (Signed)
The pt is c/o not feeling well for 3-4 days and he was working here and someone took his bp upstairs and it was high.  No previous history

## 2013-02-04 ENCOUNTER — Emergency Department (HOSPITAL_COMMUNITY)
Admission: EM | Admit: 2013-02-04 | Discharge: 2013-02-04 | Disposition: A | Discharge status: Home / Self Care | Payer: 59 | Attending: Emergency Medicine | Admitting: Emergency Medicine

## 2013-02-04 DIAGNOSIS — R030 Elevated blood-pressure reading, without diagnosis of hypertension: Secondary | ICD-10-CM

## 2013-02-04 LAB — CBC WITH DIFFERENTIAL/PLATELET
Basophils Absolute: 0 10*3/uL (ref 0.0–0.1)
Basophils Relative: 0 % (ref 0–1)
Eosinophils Absolute: 0.1 10*3/uL (ref 0.0–0.7)
Eosinophils Relative: 1 % (ref 0–5)
HCT: 40.7 % (ref 39.0–52.0)
Hemoglobin: 14 g/dL (ref 13.0–17.0)
Lymphs Abs: 3.9 10*3/uL (ref 0.7–4.0)
MCH: 31.2 pg (ref 26.0–34.0)
MCHC: 34.4 g/dL (ref 30.0–36.0)
MCV: 90.6 fL (ref 78.0–100.0)
Monocytes Absolute: 0.7 10*3/uL (ref 0.1–1.0)
Monocytes Relative: 8 % (ref 3–12)
Neutrophils Relative %: 49 % (ref 43–77)
Platelets: 268 10*3/uL (ref 150–400)
RDW: 14.3 % (ref 11.5–15.5)

## 2013-02-04 LAB — POCT I-STAT, CHEM 8
Calcium, Ion: 1.2 mmol/L (ref 1.12–1.23)
Creatinine, Ser: 0.9 mg/dL (ref 0.50–1.35)
Glucose, Bld: 110 mg/dL — ABNORMAL HIGH (ref 70–99)
HCT: 45 % (ref 39.0–52.0)
Hemoglobin: 15.3 g/dL (ref 13.0–17.0)
Sodium: 142 mEq/L (ref 135–145)

## 2013-02-04 LAB — URINALYSIS, ROUTINE W REFLEX MICROSCOPIC
Bilirubin Urine: NEGATIVE
Ketones, ur: NEGATIVE mg/dL
Nitrite: NEGATIVE
Protein, ur: NEGATIVE mg/dL
pH: 6.5 (ref 5.0–8.0)

## 2013-02-04 LAB — URINE MICROSCOPIC-ADD ON

## 2013-02-04 NOTE — ED Provider Notes (Signed)
History    CSN: [id number] Arrival date & time 02/03/13  2224  First MD Initiated Contact with Patient 02/04/13 0102     Chief Complaint  Patient presents with  . Hypertension   (Consider location/radiation/quality/duration/timing/severity/associated sxs/prior Treatment) HPI 52 yo male presents to the ER with 2-3 days of lightheadedness, dizziness upon standing in the morning when getting out of bed, some intermittent blurred vision.  Sxs ongoing for about a week.  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  Pt denies chest pain, sob, abd pain, headache, urinary problems.  He denies prior h/o htn.  Pt wears glasses, unsure when his last eye exam was done.  Pt is a 0.5 ppd smoker.   Past Medical History  Diagnosis Date  . GERD (gastroesophageal reflux disease)   . History of epistaxis     required cauterization '08  . Fracture of thumb, left, closed may '06  . Esophageal dysmotility     Dx barium swallow.    Past Surgical History  Procedure Laterality Date  . Hiatal hernia repair  2010   Family History  Problem Relation Age of Onset  . Dementia Mother   . Cancer Neg Hx   . COPD Neg Hx   . Drug abuse Neg Hx   . Diabetes Neg Hx   . Hyperlipidemia Neg Hx   . Hypertension Neg Hx   . Heart disease Neg Hx    History  Substance Use Topics  . Smoking status: Current Every Day Smoker -- 0.50 packs/day for 30 years    Types: Cigarettes  . Smokeless tobacco: Never Used     Comment: 10 cigs per day  . Alcohol Use: 3.5 oz/week    7 drink(s) per week    Review of Systems  All other systems reviewed and are negative.    Allergies  Review of patient's allergies indicates no known allergies.  Home Medications   Current Outpatient Rx  Name  Route  Sig  Dispense  Refill  . aspirin 81 MG chewable tablet   Oral   Chew 81 mg by mouth daily.         Marland Kitchen esomeprazole (NEXIUM) 40 MG capsule   Oral  Take 1 capsule (40 mg total) by mouth daily.   30 capsule   3    BP 159/100  Pulse 68  Temp(Src) 98.2 F (36.8 C)  Resp 18  SpO2 100% Physical Exam  Nursing note and vitals reviewed. Constitutional: He is oriented to person, place, and time. He appears well-developed and well-nourished.  HENT:  Head: Normocephalic and atraumatic.  Right Ear: External ear normal.  Left Ear: External ear normal.  Nose: Nose normal.  Mouth/Throat: Oropharynx is clear and moist.  Eyes: Conjunctivae and EOM are normal. Pupils are equal, round, and reactive to light.  Neck: Normal range of motion. Neck supple. No JVD present. No tracheal deviation present. No thyromegaly present.  Cardiovascular: Normal rate, regular rhythm, normal heart sounds and intact distal pulses.  Exam reveals no gallop and no friction rub.   No murmur heard. Pulmonary/Chest: Effort normal and breath sounds normal. No stridor. No respiratory distress. He has no wheezes. He has no rales. He exhibits no tenderness.  Abdominal: Soft. Bowel sounds are normal. He exhibits no distension and no mass. There is no tenderness. There is no rebound and no guarding.  Musculoskeletal: Normal range of motion. He exhibits no  edema and no tenderness.  Lymphadenopathy:    He has no cervical adenopathy.  Neurological: He is alert and oriented to person, place, and time. He has normal reflexes. No cranial nerve deficit. He exhibits normal muscle tone. Coordination normal.  Skin: Skin is warm and dry. No rash noted. No erythema. No pallor.  Psychiatric: He has a normal mood and affect. His behavior is normal. Judgment and thought content normal.    ED Course  Procedures (including critical care time) Labs Reviewed  URINALYSIS, ROUTINE W REFLEX MICROSCOPIC - Abnormal; Notable for the following:    Hgb urine dipstick TRACE (*)    Leukocytes, UA TRACE (*)    All other components within normal limits  POCT I-STAT, CHEM 8 - Abnormal; Notable for the  following:    Glucose, Bld 110 (*)    All other components within normal limits  CBC WITH DIFFERENTIAL  URINE MICROSCOPIC-ADD ON    Date: 02/04/2013  Rate: 72  Rhythm: normal sinus rhythm  QRS Axis: normal  Intervals: normal  ST/T Wave abnormalities: normal  Conduction Disutrbances:none  Narrative Interpretation: LVH noted  Old EKG Reviewed: none available    No results found. 1. Elevated blood pressure reading without diagnosis of hypertension     MDM  52 yo male with mild dizziness, elevated BP.  Will have patient keep a log of BPs over the weekend, f/u with pcm.  No signs of end organ damage.  Olivia Mackie, MD 02/04/13 [phone]

## 2013-02-08 ENCOUNTER — Telehealth: Payer: Self-pay | Admitting: Internal Medicine

## 2013-02-08 NOTE — Telephone Encounter (Signed)
Message copied by Newell Coral on Tue Feb 08, 2013 10:06 AM ------      Message from: Jacques Navy      Created: Fri Feb 04, 2013 12:04 PM       Needs OV week ending July 11 - may be added on.  Thanks ------

## 2013-02-08 NOTE — Telephone Encounter (Signed)
Have been trying to call patient, no answer.  Lvmom.  Pt is scheduled for apt on the 14th.  Will keep trying if call is not returned.

## 2013-02-14 ENCOUNTER — Encounter: Payer: Self-pay | Admitting: Internal Medicine

## 2013-02-14 ENCOUNTER — Ambulatory Visit (INDEPENDENT_AMBULATORY_CARE_PROVIDER_SITE_OTHER): Payer: 59 | Admitting: Internal Medicine

## 2013-02-14 VITALS — BP 148/110 | HR 82 | Temp 98.4°F | Ht 67.0 in | Wt 146.0 lb

## 2013-02-14 DIAGNOSIS — I10 Essential (primary) hypertension: Secondary | ICD-10-CM

## 2013-02-14 MED ORDER — LOSARTAN POTASSIUM 50 MG PO TABS: 50.0000 mg | ORAL_TABLET | Freq: Every day | ORAL | Status: DC

## 2013-02-14 NOTE — Patient Instructions (Addendum)
New onset hypertension. Had a very thorough exam in the ED July 4th including an EKG which did demonstrate left ventricular hypertrophy but was otherwise normal. All labs were done and were normal including electrolytes and renal function.  Plan Start medication: losartan 50 mg once a day an Angiotension receptor blockade (ARB) which should be very effective  Will need a metabolic panel 3-4 weeks after starting medication  Report back readings by MyChart and we can make dosage adjustments without an office visit.

## 2013-02-14 NOTE — Progress Notes (Signed)
  Subjective:    Patient ID: Matthew Roberts, male    DOB: 09/08/1960, 52 y.o.   MRN: [id number]  HPI Mr. Mealey present for evaluation of elevated Blood presure: at [facility] he had full ED evaluation July 9th: reviewed EKG, labs and MD note. Initial BP 177/107, today 148/100. He has had a little light-headedness. He may also have some blurred vision. May have family history of BP. This is a new problem for him.   Past Medical History  Diagnosis Date  . GERD (gastroesophageal reflux disease)   . History of epistaxis     required cauterization '08  . Fracture of thumb, left, closed may '06  . Esophageal dysmotility     Dx barium swallow.    Past Surgical History  Procedure Laterality Date  . Hiatal hernia repair  2010   Family History  Problem Relation Age of Onset  . Dementia Mother   . Cancer Neg Hx   . COPD Neg Hx   . Drug abuse Neg Hx   . Diabetes Neg Hx   . Hyperlipidemia Neg Hx   . Hypertension Neg Hx   . Heart disease Neg Hx    History   Social History  . Marital Status: Single    Spouse Name: N/A    Number of Children: N/A  . 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 Smoking status: Current Every Day Smoker -- 0.50 packs/day for 30 years    Types: Cigarettes  . Smokeless tobacco: Never Used     Comment: 10 cigs per day  . Alcohol Use: 3.5 oz/week    7 drink(s) per week  . Drug Use: No  . Sexually Active: Yes -- Male partner(s)   Other Topics Concern  . Not on file   Social History Narrative   HSG. 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 Married - '82 - 59yrs/divorced. No children. Serially monogamous. 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 No pets.     Current Outpatient Prescriptions on File Prior to Visit  Medication Sig Dispense Refill  . aspirin 81 MG chewable tablet Chew 81 mg by mouth daily.      Marland Kitchen esomeprazole (NEXIUM) 40 MG capsule Take 1 capsule (40 mg total) by mouth  daily.  30 capsule  3   No current facility-administered medications on file prior to visit.      Review of Systems System review is negative for any constitutional, cardiac, pulmonary, GI or neuro symptoms or complaints other than as described in the HPI.     Objective:   Physical Exam Filed Vitals:   02/14/13 1136  BP: 148/110  Pulse: 82  Temp: 98.4 F (36.9 C)   BP Readings from Last 3 Encounters:  02/14/13 148/110  02/04/13 159/100  07/26/12 132/79   Gen'l WNWD AA man in no distress HEENT_ Fundi - normal disk margins, no AV nicking, no hemorrhages. Cor- RRR Neuro - A&O x 3, normal exam.       Assessment & Plan:

## 2013-02-14 NOTE — Assessment & Plan Note (Signed)
New onset Hypertension. No need to repeat EKG or lab.  PLan  Discussed alternatives for medical management. He prefers to avoid diuretics.  Will start losartan 50 mg daily  Monitor BP and report back by MyChart.

## 2013-02-19 ENCOUNTER — Emergency Department (HOSPITAL_COMMUNITY)
Admission: EM | Admit: 2013-02-19 | Discharge: 2013-02-19 | Disposition: A | Discharge status: Home / Self Care | Payer: 59 | Source: Home / Self Care | Attending: Emergency Medicine | Admitting: Emergency Medicine

## 2013-02-19 ENCOUNTER — Encounter (HOSPITAL_COMMUNITY): Payer: Self-pay | Admitting: Emergency Medicine

## 2013-02-19 DIAGNOSIS — R42 Dizziness and giddiness: Secondary | ICD-10-CM

## 2013-02-19 DIAGNOSIS — I10 Essential (primary) hypertension: Secondary | ICD-10-CM

## 2013-02-19 LAB — POCT I-STAT, CHEM 8
Calcium, Ion: 1.2 mmol/L (ref 1.12–1.23)
Chloride: 102 mEq/L (ref 96–112)
Glucose, Bld: 106 mg/dL — ABNORMAL HIGH (ref 70–99)
HCT: 43 % (ref 39.0–52.0)
TCO2: 26 mmol/L (ref 0–100)

## 2013-02-19 NOTE — ED Notes (Signed)
Pt reports rx dx of hypertenstion. Just started bp med on Monday.  Pt states that today he has felt light headed and having blurry vision. Pt had pressure checked at work and it was elevated.  Pt states that he had a similar episode on 7/3. Denies chest pain and any other symptoms.

## 2013-02-19 NOTE — ED Provider Notes (Signed)
Chief Complaint:   Chief Complaint  Patient presents with  . Hypertension    just started bp med on monday for the first time. feeling light headed , blurred vision, and rapid heart rate.     History of Present Illness:   Matthew Roberts is a 52 year old male who presents today for a recheck on his blood pressure. He had a dizzy spell on July 3 and was seen at the emergency room for this. He had a complete workup including blood work, EKG, urinalysis, and everything was found to be normal except his blood pressure is elevated and he was referred back to his primary care physician, Dr. Illene Regulus who this past Monday, which is 6 days ago, started him on losartan 50 mg per day. At that time his blood pressure was 188 systolic. He's been taking the losartan daily since then and done well with that. This morning, right after waking up, he was drinking his coffee and felt lightheaded. He describes this as a sensation of near syncope. He denies any whirling vertigo or spinning sensation. The sensation lasted about an hour. The patient states that this was the same sensation he felt on July 3 when he went to the emergency room. He was accompanied by a somewhat rapid heartbeat, blurry vision, both feet feeling tingly, and feeling off balance. He went on to work and his blood pressure checked there and was found to be 147/101. He comes here to get his blood pressure checked. He states that right now he feels fine. He specifically denies any headache, diplopia, chest pain, tightness, pressure, shortness of breath, focal muscular weakness, difficulty swallowing or speaking, or difficulty with ambulation.  Review of Systems:  Other than noted above, the patient denies any of the following symptoms: Systemic:  No fever, chills, fatigue, weight loss or gain. Respiratory:  No coughing, wheezing, or shortness of breath. Cardiac:  No chest pain, tightness, pressure, palpitations, syncope, or edema. Neuro:  No  headache, dizziness, blurred vision, weakness, paresthesias, or strokelike symptoms.  PMFSH:  Past medical history, family history, social history, meds, and allergies were reviewed.  Physical Exam:   Vital signs:  BP 150/101  Pulse 90  Temp(Src) 98.9 F (37.2 C) (Oral)  Resp 18  SpO2 99%  Filed Vitals:   02/19/13 1606 02/19/13 1658 Supine  02/19/13 1659 Sitting  02/19/13 1701 Standing   BP: 148/99 165/93 154/95 150/101  Pulse: 95 77 82 90  Temp: 98.9 F (37.2 C)     TempSrc: Oral     Resp: 18     SpO2: 99%      General:  Alert, oriented, in no distress. Lungs:  Breath sounds clear and equal bilaterally.  No wheezes, rales, or rhonchi. Heart:  Regular rhythm, no gallops, murmers, clicks or rubs.  Abdomen:  Soft and flat.  Nontender, no organomegaly or mass.  No pulsatile midline abdominal mass or bruit. Ext:  No edema, pulses full. Neurological exam:  Alert and oriented.  Speech is clear.  No pronator drift.  CNs intact. Romberg sign is negative. Able to perform tandem gait well.  Labs:   Results for orders placed during the hospital encounter of 02/19/13  POCT I-STAT, CHEM 8      Result Value Range   Sodium 141  135 - 145 mEq/L   Potassium 3.8  3.5 - 5.1 mEq/L   Chloride 102  96 - 112 mEq/L   BUN 8  6 - 23 mg/dL   Creatinine, Ser  0.90  0.50 - 1.35 mg/dL   Glucose, Bld 782 (*) 70 - 99 mg/dL   Calcium, Ion 9.56  1.12 - 1.23 mmol/L   TCO2 26  0 - 100 mmol/L   Hemoglobin 14.6  13.0 - 17.0 g/dL   HCT 21.3  08.6 - 57.8 %    EKG Results:  Date: 02/19/2013  Rate: 72  Rhythm: normal sinus rhythm  QRS Axis: normal  Intervals: normal  ST/T Wave abnormalities: normal  Conduction Disutrbances:none  Narrative Interpretation: Normal sinus rhythm, voltage criteria for left ventricular hypertrophy, no change from previous EKG of 02/03/2013.  Old EKG Reviewed: unchanged  Assessment:  The primary encounter diagnosis was Essential hypertension, benign. A diagnosis of Dizziness  was also pertinent to this visit.  His blood pressure appears to becoming down. I do not think he needs a change in his blood pressure right now, since has only been 6 days he's been on I advised him to continue to monitor it in to followup with his primary care physician, Dr. Illene Regulus. I am not certain as to the cause of his symptoms. It appears he's had a complete workup both now and on July 3 when he had his first episode. I told him if this continues, it would be a good idea for him to see a neurologist and he was given the name of Dr. Porfirio Mylar Dohmeier.  Plan:   1.  The following meds were prescribed:   Discharge Medication List as of 02/19/2013  5:21 PM     2.  The patient was instructed in symptomatic care and handouts were given. Specifically discussed salt and sodium restriction, weight control, and exercise. 3.  The patient was told to return if becoming worse in any way, if no better in 3 or 4 days, and given some red flag symptoms such as severe headache, visual changes, shortness of breath, chest discomfort, or new neurological symptoms that would indicate earlier return. 4.  Follow up with Dr. Debby Bud in 2 weeks. He already has an appointment.    Reuben Likes, MD 02/19/13 [phone]

## 2013-02-23 ENCOUNTER — Encounter: Payer: Self-pay | Admitting: Internal Medicine

## 2013-02-23 ENCOUNTER — Other Ambulatory Visit: Payer: Self-pay | Admitting: Internal Medicine

## 2013-02-23 MED ORDER — FUROSEMIDE 20 MG PO TABS: 20.0000 mg | ORAL_TABLET | Freq: Every day | ORAL | Status: DC

## 2013-03-02 ENCOUNTER — Ambulatory Visit (INDEPENDENT_AMBULATORY_CARE_PROVIDER_SITE_OTHER): Payer: 59

## 2013-03-02 DIAGNOSIS — Z23 Encounter for immunization: Secondary | ICD-10-CM

## 2013-03-14 ENCOUNTER — Encounter: Payer: Self-pay | Admitting: Internal Medicine

## 2013-03-15 ENCOUNTER — Ambulatory Visit: Payer: 59 | Admitting: Internal Medicine

## 2013-03-16 ENCOUNTER — Encounter: Payer: Self-pay | Admitting: Internal Medicine

## 2013-03-16 ENCOUNTER — Ambulatory Visit (INDEPENDENT_AMBULATORY_CARE_PROVIDER_SITE_OTHER): Payer: 59 | Admitting: Internal Medicine

## 2013-03-16 VITALS — BP 128/100 | HR 110 | Temp 99.5°F | Wt 145.0 lb

## 2013-03-16 DIAGNOSIS — I10 Essential (primary) hypertension: Secondary | ICD-10-CM

## 2013-03-16 MED ORDER — ATENOLOL 50 MG PO TABS: 50.0000 mg | ORAL_TABLET | Freq: Every day | ORAL | Status: DC

## 2013-03-16 MED ORDER — FUROSEMIDE 20 MG PO TABS: 20.0000 mg | ORAL_TABLET | Freq: Every day | ORAL | Status: DC

## 2013-03-16 NOTE — Assessment & Plan Note (Addendum)
Systolic blood pressure has improved since last the last visit; diastolic blood pressure remains elevated.   BP Readings from Last 3 Encounters:  03/16/13 128/100  02/19/13 150/101  02/14/13 148/110   Plan: Continue furosemide (Lasix) and change from losartan (Cozaar) to atenolol (Tenormin) for better control diastolic pressure and control of heart rate (tachycardic).  Matthew Roberts was appraised of common side effects due to atenolol and was advised to contact us through MyChart if any arise. If no problems arise, follow up in one month.   When you come back, please bring your blood pressure cuff so we can make sure it works.

## 2013-03-16 NOTE — Patient Instructions (Addendum)
Thanks for working with me Romeo Apple) today!  It's good that you've been adhering to your medications.  Your blood pressure has improved since last time we saw you.  To help even more with your blood pressure, we're going to change you from losartan (the blue pill) to atenolol.  If you have any side effects from this medication, please contact us through MyChart.  If you do not have any problems, please follow up with [place] in one month.  When you come back, please bring your blood pressure cuff so we can make sure it works.

## 2013-03-16 NOTE — Progress Notes (Signed)
Subjective:     Patient ID: Matthew Roberts, male   DOB: 10-29-1960, 52 y.o.   MRN: [id number]  HPI Mr. Beckford is here for a one-month follow-up after starting medication for his blood pressure.  He has been adherent to his medication until he ran out yesterday.  His last dose of both medications was yesterday morning.  He has been measuring the pressure at home and he's been getting a higher systolic reading (160's) and a lower diastolic reading (90's).    Review of Systems  HENT: Negative for hearing loss and ear pain.   Eyes: Negative for pain and visual disturbance.  Respiratory: Negative for cough, choking and shortness of breath.   Cardiovascular: Negative for chest pain.   Past Medical History  Diagnosis Date   GERD (gastroesophageal reflux disease)    History of epistaxis     required cauterization '08   Fracture of thumb, left, closed may '06   Esophageal dysmotility     Dx barium swallow.    Past Surgical History  Procedure Laterality Date   Hiatal hernia repair  2010   Family History  Problem Relation Age of Onset   Dementia Mother    Cancer Neg Hx    COPD Neg Hx    Drug abuse Neg Hx    Diabetes Neg Hx    Hyperlipidemia Neg Hx    Hypertension Neg Hx    Heart disease Neg Hx    History   Social History   Marital Status: Single    Spouse Name: N/A    Number of Children: N/A   Years of Education: 12   Occupational History   floor tech [employer]   Social History Main Topics   Smoking status: Current Every Day Smoker -- 0.50 packs/day for 30 years    Types: Cigarettes   Smokeless tobacco: Never Used     Comment: 10 cigs per day   Alcohol Use: 3.5 oz/week    7 drink(s) per week   Drug Use: No   Sexual Activity: Yes    Partners: Female   Other Topics Concern   Not on file   Social History Narrative   [employer]. 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 Married - '82 - 56yrs/divorced. No children. Serially monogamous. Work - Editor, commissioning  at [employer]. Lives alone. No pets.        Objective:   Physical Exam  Constitutional: He is oriented to person, place, and time. He appears well-developed and well-nourished. No distress.  HENT:  Head: Atraumatic.  Right Ear: External ear normal.  Left Ear: External ear normal.  Eyes: Conjunctivae and EOM are normal. Pupils are equal, round, and reactive to light.  Cardiovascular: Normal rate, regular rhythm and normal heart sounds.   Pulmonary/Chest: Effort normal and breath sounds normal. No respiratory distress. He has no wheezes. He has no rales. He exhibits no tenderness.  Neurological: He is oriented to person, place, and time.  Skin: He is not diaphoretic.   BP Readings from Last 3 Encounters:  03/16/13 128/100  02/19/13 150/101  02/14/13 148/110       Assessment:         Plan:

## 2013-04-11 ENCOUNTER — Encounter: Payer: Self-pay | Admitting: Internal Medicine

## 2013-04-19 ENCOUNTER — Ambulatory Visit (INDEPENDENT_AMBULATORY_CARE_PROVIDER_SITE_OTHER): Payer: 59 | Admitting: Internal Medicine

## 2013-04-19 ENCOUNTER — Encounter: Payer: Self-pay | Admitting: Internal Medicine

## 2013-04-19 VITALS — BP 124/92 | HR 76 | Temp 97.2°F | Wt 145.0 lb

## 2013-04-19 DIAGNOSIS — I10 Essential (primary) hypertension: Secondary | ICD-10-CM

## 2013-04-19 MED ORDER — VERAPAMIL HCL ER 180 MG PO TBCR: 180.0000 mg | EXTENDED_RELEASE_TABLET | Freq: Every day | ORAL | Status: DC

## 2013-04-19 NOTE — Patient Instructions (Addendum)
Better control on BB  But problems with ED  Plan  continune furosemide  Stop Tenormin  Start Verapamil ER 180 mg once a day   BP check and report via MyChart in about a week.  

## 2013-04-19 NOTE — Assessment & Plan Note (Signed)
Better control on BB  But problems with ED  Plan  continune furosemide  Stop Tenormin  Start Verapamil ER 180 mg once a day   BP check and report via MyChart in about a week.

## 2013-04-19 NOTE — Progress Notes (Signed)
  Subjective:    Patient ID: Matthew Roberts, male    DOB: [birth year], 52 y.o.   MRN: [id number]  HPI Matthew Roberts presents for BP follow up. He has better control but he is having problems with ED.  PMH, FamHx and SocHx reviewed for any changes and relevance. Current Outpatient Prescriptions on File Prior to Visit  Medication Sig Dispense Refill  . aspirin 81 MG chewable tablet Chew 81 mg by mouth daily.      Marland Kitchen atenolol (TENORMIN) 50 MG tablet Take 1 tablet (50 mg total) by mouth daily.  30 tablet  3  . esomeprazole (NEXIUM) 40 MG capsule Take 1 capsule (40 mg total) by mouth daily.  30 capsule  3  . furosemide (LASIX) 20 MG tablet Take 1 tablet (20 mg total) by mouth daily.  90 tablet  3   No current facility-administered medications on file prior to visit.      Review of Systems System review is negative for any constitutional, cardiac, pulmonary, GI or neuro symptoms or complaints other than as described in the HPI.     Objective:   Physical Exam Filed Vitals:   04/19/13 0931  BP: 124/92  Pulse: 76  Temp: 97.2 F (36.2 C)   HEENT- Right EAC w/ cerumen impaction Cor 2+ radial, RRR Pulm - normal respirations       Assessment & Plan:

## 2013-05-04 ENCOUNTER — Ambulatory Visit (INDEPENDENT_AMBULATORY_CARE_PROVIDER_SITE_OTHER): Payer: 59 | Admitting: Internal Medicine

## 2013-05-04 ENCOUNTER — Other Ambulatory Visit (INDEPENDENT_AMBULATORY_CARE_PROVIDER_SITE_OTHER): Payer: 59

## 2013-05-04 ENCOUNTER — Encounter: Payer: Self-pay | Admitting: Internal Medicine

## 2013-05-04 VITALS — BP 120/78 | HR 72 | Temp 97.9°F | Resp 12 | Wt 144.0 lb

## 2013-05-04 DIAGNOSIS — K1120 Sialoadenitis, unspecified: Secondary | ICD-10-CM

## 2013-05-04 DIAGNOSIS — F17200 Nicotine dependence, unspecified, uncomplicated: Secondary | ICD-10-CM

## 2013-05-04 DIAGNOSIS — R220 Localized swelling, mass and lump, head: Secondary | ICD-10-CM

## 2013-05-04 DIAGNOSIS — F17210 Nicotine dependence, cigarettes, uncomplicated: Secondary | ICD-10-CM

## 2013-05-04 DIAGNOSIS — R221 Localized swelling, mass and lump, neck: Secondary | ICD-10-CM | POA: Insufficient documentation

## 2013-05-04 LAB — CBC WITH DIFFERENTIAL/PLATELET
Basophils Absolute: 0.1 10*3/uL (ref 0.0–0.1)
HCT: 39.6 % (ref 39.0–52.0)
Lymphs Abs: 3.3 10*3/uL (ref 0.7–4.0)
Monocytes Absolute: 0.8 10*3/uL (ref 0.1–1.0)
Monocytes Relative: 9 % (ref 3.0–12.0)
Neutrophils Relative %: 51.6 % (ref 43.0–77.0)
Platelets: 270 10*3/uL (ref 150.0–400.0)
RDW: 14.1 % (ref 11.5–14.6)

## 2013-05-04 MED ORDER — CEPHALEXIN 500 MG PO CAPS: 500.0000 mg | ORAL_CAPSULE | Freq: Four times a day (QID) | ORAL | Status: DC

## 2013-05-04 NOTE — Patient Instructions (Signed)
Lemon in water 

## 2013-05-04 NOTE — Progress Notes (Signed)
  Subjective:    Patient ID: Matthew Roberts, male    DOB: Sep 08, 1960, 52 y.o.   MRN: [id number]  HPI  C/o neck mass R noticed today, sensitive  Review of Systems  Constitutional: Negative for fever, chills and unexpected weight change.  HENT: Negative for nosebleeds, congestion, trouble swallowing, neck pain, dental problem, sinus pressure and ear discharge.   Respiratory: Negative for cough and wheezing.   Cardiovascular: Negative for leg swelling.  Gastrointestinal: Negative for abdominal pain.  Endocrine: Negative for polydipsia and polyphagia.  Musculoskeletal: Negative for arthralgias.  Skin: Negative for rash.  Hematological: Negative for adenopathy. Does not bruise/bleed easily.   Wt Readings from Last 3 Encounters:  05/04/13 144 lb (65.318 kg)  04/19/13 145 lb (65.772 kg)  03/16/13 145 lb (65.772 kg)       Objective:   Physical Exam  Constitutional: He is oriented to person, place, and time. He appears well-developed. No distress.  NAD  HENT:  Mouth/Throat: Oropharynx is clear and moist.  Eyes: Conjunctivae are normal. Pupils are equal, round, and reactive to light.  Neck: Normal range of motion. No JVD present. No thyromegaly present.  Cardiovascular: Normal rate, regular rhythm, normal heart sounds and intact distal pulses.  Exam reveals no gallop and no friction rub.   No murmur heard. Pulmonary/Chest: Effort normal and breath sounds normal. No respiratory distress. He has no wheezes. He has no rales. He exhibits no tenderness.  Abdominal: Soft. Bowel sounds are normal. He exhibits no distension and no mass. There is no tenderness. There is no rebound and no guarding.  Musculoskeletal: Normal range of motion. He exhibits no edema and no tenderness.  Lymphadenopathy:    He has no cervical adenopathy.  Neurological: He is alert and oriented to person, place, and time. He has normal reflexes. No cranial nerve deficit. He exhibits normal muscle tone. He displays a  negative Romberg sign. Coordination and gait normal.  No meningeal signs  Skin: Skin is warm and dry. No rash noted.  Psychiatric: He has a normal mood and affect. His behavior is normal. Judgment and thought content normal.      Procedure Note :    Procedure :   Point of care (POC) sonography examination   Indication: r neck swelling - acute   Equipment used: Sonosite M-Turbo with HFL38x/13-6 MHz transducer linear probe. The images were stored in the unit and later transferred in storage.  The patient was placed in a decubitus position.  This study revealed a hypoechoic mass in the right distal lateral neck (submandibular area) c/w enlarge salivary gland w/dilated ducts.   Impression: R acute sialoadenitis (error marker "L" on images)       Assessment & Plan:

## 2013-05-04 NOTE — Assessment & Plan Note (Signed)
10/14 R acute [place] POC CBC Keflex ENT if not better Lemon in water

## 2013-05-04 NOTE — Assessment & Plan Note (Signed)
CBC POC [place]

## 2013-05-04 NOTE — Assessment & Plan Note (Signed)
Needs to quit.  

## 2013-05-18 ENCOUNTER — Ambulatory Visit: Payer: 59 | Admitting: Internal Medicine

## 2013-06-09 ENCOUNTER — Other Ambulatory Visit: Payer: Self-pay

## 2013-09-26 ENCOUNTER — Encounter: Payer: Self-pay | Admitting: Gastroenterology

## 2013-11-02 ENCOUNTER — Ambulatory Visit (AMBULATORY_SURGERY_CENTER): Payer: Self-pay

## 2013-11-02 VITALS — Ht 67.0 in | Wt 147.0 lb

## 2013-11-02 DIAGNOSIS — Z1211 Encounter for screening for malignant neoplasm of colon: Secondary | ICD-10-CM

## 2013-11-02 MED ORDER — SUPREP BOWEL PREP KIT 17.5-3.13-1.6 GM/177ML PO SOLN: 1.0000 | Freq: Once | ORAL | Status: DC

## 2013-11-15 ENCOUNTER — Ambulatory Visit (AMBULATORY_SURGERY_CENTER): Payer: 59 | Admitting: Gastroenterology

## 2013-11-15 ENCOUNTER — Encounter: Payer: Self-pay | Admitting: Gastroenterology

## 2013-11-15 VITALS — BP 142/72 | HR 86 | Temp 98.1°F | Resp 33 | Ht 67.0 in | Wt 147.0 lb

## 2013-11-15 DIAGNOSIS — D126 Benign neoplasm of colon, unspecified: Secondary | ICD-10-CM

## 2013-11-15 DIAGNOSIS — Z1211 Encounter for screening for malignant neoplasm of colon: Secondary | ICD-10-CM

## 2013-11-15 MED ORDER — SODIUM CHLORIDE 0.9 % IV SOLN
500.0000 mL | INTRAVENOUS | Status: DC
Start: 2013-11-15 — End: 2013-11-16

## 2013-11-15 NOTE — Progress Notes (Signed)
Called to room to assist during endoscopic procedure.  Patient ID and intended procedure confirmed with present staff. Received instructions for my participation in the procedure from the performing physician.  

## 2013-11-15 NOTE — Progress Notes (Signed)
Lidocaine-40mg IV prior to Propofol InductionPropofol given over incremental dosages 

## 2013-11-15 NOTE — Patient Instructions (Addendum)
YOU HAD AN ENDOSCOPIC PROCEDURE TODAY AT THE [facility]: Refer to the procedure report that was given to you for any specific questions about what was found during the examination.  If the procedure report does not answer your questions, please call your gastroenterologist to clarify.  If you requested that your care partner not be given the details of your procedure findings, then the procedure report has been included in a sealed envelope for you to review at your convenience later.  YOU SHOULD EXPECT: Some feelings of bloating in the abdomen. Passage of more gas than usual.  Walking can help get rid of the air that was put into your GI tract during the procedure and reduce the bloating. If you had a lower endoscopy (such as a colonoscopy or flexible sigmoidoscopy) you may notice spotting of blood in your stool or on the toilet paper. If you underwent a bowel prep for your procedure, then you may not have a normal bowel movement for a few days.  DIET: Your first meal following the procedure should be a light meal and then it is ok to progress to your normal diet.  A half-sandwich or bowl of soup is an example of a good first meal.  Heavy or fried foods are harder to digest and may make you feel nauseous or bloated.  Likewise meals heavy in dairy and vegetables can cause extra gas to form and this can also increase the bloating.  Drink plenty of fluids but you should avoid alcoholic beverages for 24 hours.  ACTIVITY: Your care partner should take you home directly after the procedure.  You should plan to take it easy, moving slowly for the rest of the day.  You can resume normal activity the day after the procedure however you should NOT DRIVE or use heavy machinery for 24 hours (because of the sedation medicines used during the test).    SYMPTOMS TO REPORT IMMEDIATELY: A gastroenterologist can be reached at any hour.  During normal business hours, 8:30 AM to 5:00 PM Monday through Friday,  call [phone].  After hours and on weekends, please call the GI answering service at [phone] who will take a message and have the physician on call contact you.   Following lower endoscopy (colonoscopy or flexible sigmoidoscopy):  Excessive amounts of blood in the stool  Significant tenderness or worsening of abdominal pains  Swelling of the abdomen that is new, acute  Fever of 100F or higher  FOLLOW UP: If any biopsies were taken you will be contacted by phone or by letter within the next 1-3 weeks.  Call your gastroenterologist if you have not heard about the biopsies in 3 weeks.  Our staff will call the home number listed on your records the next business day following your procedure to check on you and address any questions or concerns that you may have at that time regarding the information given to you following your procedure. This is a courtesy call and so if there is no answer at the home number and we have not heard from you through the emergency physician on call, we will assume that you have returned to your regular daily activities without incident.  SIGNATURES/CONFIDENTIALITY: You and/or your care partner have signed paperwork which will be entered into your electronic medical record.  These signatures attest to the fact that that the information above on your After Visit Summary has been reviewed and is understood.  Full responsibility of the confidentiality of this   discharge information lies with you and/or your care-partner.  See handouts on polyps and hemorrhoids.

## 2013-11-15 NOTE — Progress Notes (Signed)
Pt blood pressure is elevated .  Right arm 149/110 and left arm 142/100.  Per the pt he denies any sx such as headache, dizziness or confusion.  He states, "I just did not take my blood pressure medication this morning".  I notified Tilman Neat, CRNA of elevated blood pressure and asymptomatic.  He said will proceed with colonoscopy. Maw

## 2013-11-15 NOTE — Op Note (Signed)
[place]  [address] [place], [id number]   COLONOSCOPY PROCEDURE REPORT  PATIENT: Matthew, Roberts  MR#: [id number] BIRTHDATE: 1961-01-20 , 72  yrs. old GENDER: Male ENDOSCOPIST: Inda Castle, MD REFERRED PP:[id number] Marquis Lunch, M.D. PROCEDURE DATE:  11/15/2013 PROCEDURE:   Colonoscopy with snare polypectomy First Screening Colonoscopy - Avg.  risk and is 50 yrs.  old or older Yes.  Prior Negative Screening - Now for repeat screening. N/A  History of Adenoma - Now for follow-up colonoscopy & has been > or = to 3 yrs.  N/A  Polyps Removed Today? Yes. ASA CLASS:   Class II INDICATIONS:average risk screening. MEDICATIONS: MAC sedation, administered by CRNA and propofol (Diprivan) 300mg  IV  DESCRIPTION OF PROCEDURE:   After the risks benefits and alternatives of the procedure were thoroughly explained, informed consent was obtained.  A digital rectal exam revealed no abnormalities of the rectum.   The LB [id number]  endoscope was introduced through the anus and advanced to the cecum, which was identified by both the appendix and ileocecal valve. No adverse events experienced.   The quality of the prep was Suprep good  The instrument was then slowly withdrawn as the colon was fully examined.      COLON FINDINGS: A sessile polyp measuring 5 mm in size was found in the sigmoid colon.  A polypectomy was performed with a cold snare. The resection was complete and the polyp tissue was completely retrieved.   Internal hemorrhoids were found.   The colon was otherwise normal.  There was no diverticulosis, inflammation, polyps or cancers unless previously stated.  Retroflexed views revealed no abnormalities. The time to cecum=3 minutes 03 seconds. Withdrawal time=8 minutes 40 seconds.  The scope was withdrawn and the procedure completed. COMPLICATIONS: There were no complications.  ENDOSCOPIC IMPRESSION: 1.   Sessile polyp measuring 5 mm in size was  found in the sigmoid colon; polypectomy was performed with a cold snare 2.   Internal hemorrhoids 3.   The colon was otherwise normal  RECOMMENDATIONS: If the polyp(s) removed today are proven to be adenomatous (pre-cancerous) polyps, you will need a repeat colonoscopy in 5 years.  Otherwise you should continue to follow colorectal cancer screening guidelines for "routine risk" patients with colonoscopy in 10 years.  You will receive a letter within 1-2 weeks with the results of your biopsy as well as final recommendations.  Please call my office if you have not received a letter after 3 weeks.   eSigned:  Inda Castle, MD 11/15/2013 8:50 AM   cc:   PATIENT NAME:  Matthew, Roberts MR#: [id number]

## 2013-11-16 ENCOUNTER — Telehealth: Payer: Self-pay | Admitting: *Deleted

## 2013-11-16 NOTE — Telephone Encounter (Signed)
Message left

## 2013-11-21 ENCOUNTER — Encounter: Payer: Self-pay | Admitting: Gastroenterology

## 2013-11-21 ENCOUNTER — Encounter: Payer: Self-pay | Admitting: Internal Medicine

## 2013-11-21 ENCOUNTER — Ambulatory Visit (INDEPENDENT_AMBULATORY_CARE_PROVIDER_SITE_OTHER): Payer: 59 | Admitting: Internal Medicine

## 2013-11-21 VITALS — BP 140/98 | HR 114 | Temp 99.5°F | Resp 16 | Wt 148.6 lb

## 2013-11-21 DIAGNOSIS — I10 Essential (primary) hypertension: Secondary | ICD-10-CM

## 2013-11-21 MED ORDER — HYDROCHLOROTHIAZIDE 12.5 MG PO CAPS: 12.5000 mg | ORAL_CAPSULE | Freq: Every day | ORAL | Status: DC

## 2013-11-21 NOTE — Progress Notes (Signed)
   Subjective:    Patient ID: Matthew Roberts, male    DOB: February 07, 1961, 53 y.o.   MRN: [id number]  HPI Came to follow up HTN. BP was high (149/110) at colonoscopy this month.  Blood pressure range / average : Checked at [facility] last week, systolic was 625 and dialstolic was "low"-does not know number. Has not been checking at home. Today's BP is 140/98.  Compliant with anti hypertemsive medication.  No lightheadedness or other adverse medication effect described.  A heart healthy diet is not followed /low salt diet is not followed.  No regular exercise but he does a lot of walking in housekeeping job.  Family history is postive for HTN-sister.  Had normal dilated eye exam last week per patient.  No hx of gout.  He has smoked 1 ppd for 30 years. He is currently trying to quit smoking by weaning himself off.  Review of Systems  Significant headaches, epistaxis, chest pain, palpitations, claudication, paroxysmal nocturnal dyspnea, or edema absent. Does have some exertional dyspnea he related to cigarette smoking.       Objective:   Physical Exam Constitutional: He is oriented to person, place, and time. He appears well-developed and well-nourished. No distress.  HENT:  Head: Normocephalic and atraumatic.  Mouth/Throat: No oropharyngeal exudate.  Eyes: Pupils are equal, round, and reactive to light. Arteriolar narrowing Neck: Normal range of motion. Neck supple.  Cardiovascular: Normal rate, regular rhythm, normal heart sounds and intact distal pulses. S4 w/o murmur.Pulse recheck-96. No carotid or abdominal bruits. No ankle/foot edema.  Slightly decreased DPP Pulmonary/Chest: Effort normal ;decreased breath sounds normal. No respiratory distress.  Abdominal: Soft. Bowel sounds are normal. He exhibits no distension and no mass. There is no tenderness. No AAA Lymphadenopathy:  He has no cervical adenopathy (No axillary adenopathy.).  Neurological: He is alert and oriented to person,  place, and time. He displays normal reflexes.  Skin: Skin is warm and dry. He is not diaphoretic.         Assessment & Plan:  #1 HTN , uncontrolled See orders & recommendations documented in AVS

## 2013-11-21 NOTE — Progress Notes (Signed)
   Subjective:    Patient ID: Matthew Roberts, male    DOB: 26-Apr-1961, 53 y.o.   MRN: [id number]  HPI Came to see about HTN. BP was high (149/110)  at colonoscopy in April 2015.   Review of Systems Blood pressure range / average : Checked at [facility] last week, systolic was 924 and dialstolic was "low"-does not know number. Has not been checking at home. Today's BP is 140/98. Compliant with anti hypertemsive medication. No lightheadedness or other adverse medication effect described.  A heart healthy diet is not followed /low salt diet is not followed. No  He does a lot of walking in housekeeping job. Family history is postive for HTN-sister. Had normal dilated eye exam last week per patient. No hx of gout.  Significant headaches, epistaxis, chest pain, palpitations, claudication, paroxysmal nocturnal dyspnea, or edema absent. Does have some exertional dyspnea he related to cigarette smoking.  Smokes 1 ppd for 30 years. He is currently trying to quit smoking by weaning himself off. Objective:   Physical Exam  Constitutional: He is oriented to person, place, and time. He appears well-developed and well-nourished. No distress.  HENT:  Head: Normocephalic and atraumatic.  Mouth/Throat: No oropharyngeal exudate.  Eyes: Pupils are equal, round, and reactive to light.  Neck: Normal range of motion. Neck supple.  Cardiovascular: Normal rate, regular rhythm, normal heart sounds and intact distal pulses.   No carotid or abdominal bruits. No ankle/foot edema.  Pulmonary/Chest: Effort normal and breath sounds normal. No respiratory distress.  Abdominal: Soft. Bowel sounds are normal. He exhibits no distension and no mass. There is no tenderness.  Lymphadenopathy:    He has no cervical adenopathy (No axillary adenopathy.).  Neurological: He is alert and oriented to person, place, and time. He displays normal reflexes.  Skin: Skin is warm and dry. He is not diaphoretic.            Assessment & Plan:

## 2013-11-21 NOTE — Patient Instructions (Signed)

## 2013-11-21 NOTE — Progress Notes (Signed)
Pre visit review using our clinic review tool, if applicable. No additional management support is needed unless otherwise documented below in the visit note. 

## 2013-11-22 ENCOUNTER — Telehealth: Payer: Self-pay | Admitting: Internal Medicine

## 2013-11-22 NOTE — Telephone Encounter (Signed)
Relevant patient education assigned to patient using Emmi. ° °

## 2014-02-21 ENCOUNTER — Other Ambulatory Visit: Payer: Self-pay

## 2014-02-21 MED ORDER — HYDROCHLOROTHIAZIDE 12.5 MG PO CAPS: 12.5000 mg | ORAL_CAPSULE | Freq: Every day | ORAL | Status: DC

## 2014-02-21 NOTE — Telephone Encounter (Signed)
OK # [id number] , R X1

## 2014-02-21 NOTE — Telephone Encounter (Signed)
Okay for refill?  

## 2014-04-13 ENCOUNTER — Other Ambulatory Visit: Payer: Self-pay

## 2014-04-13 MED ORDER — VERAPAMIL HCL ER 180 MG PO TBCR: 180.0000 mg | EXTENDED_RELEASE_TABLET | Freq: Every day | ORAL | Status: DC

## 2014-04-18 ENCOUNTER — Ambulatory Visit (INDEPENDENT_AMBULATORY_CARE_PROVIDER_SITE_OTHER): Payer: 59 | Admitting: Internal Medicine

## 2014-04-18 ENCOUNTER — Encounter: Payer: Self-pay | Admitting: Internal Medicine

## 2014-04-18 VITALS — BP 150/110 | HR 91 | Temp 98.6°F | Wt 143.2 lb

## 2014-04-18 DIAGNOSIS — F17210 Nicotine dependence, cigarettes, uncomplicated: Secondary | ICD-10-CM

## 2014-04-18 DIAGNOSIS — F17200 Nicotine dependence, unspecified, uncomplicated: Secondary | ICD-10-CM

## 2014-04-18 DIAGNOSIS — R739 Hyperglycemia, unspecified: Secondary | ICD-10-CM

## 2014-04-18 DIAGNOSIS — E785 Hyperlipidemia, unspecified: Secondary | ICD-10-CM

## 2014-04-18 DIAGNOSIS — R7309 Other abnormal glucose: Secondary | ICD-10-CM

## 2014-04-18 DIAGNOSIS — I10 Essential (primary) hypertension: Secondary | ICD-10-CM

## 2014-04-18 MED ORDER — HYDROCHLOROTHIAZIDE 12.5 MG PO CAPS: 12.5000 mg | ORAL_CAPSULE | Freq: Every day | ORAL | Status: DC

## 2014-04-18 MED ORDER — VERAPAMIL HCL ER 180 MG PO TBCR: 180.0000 mg | EXTENDED_RELEASE_TABLET | Freq: Every day | ORAL | Status: DC

## 2014-04-18 NOTE — Assessment & Plan Note (Signed)
A1c

## 2014-04-18 NOTE — Assessment & Plan Note (Signed)
NMR Lipoprofile  

## 2014-04-18 NOTE — Assessment & Plan Note (Signed)
Renew meds Blood pressure goals reviewed. BMET

## 2014-04-18 NOTE — Progress Notes (Signed)
   Subjective:    Patient ID: Matthew Roberts, male    DOB: 1961-05-02, 53 y.o.   MRN: [id number]  HPI   He ran of his blood pressure medicines 04/14/14. Prior to that date he had been compliant with the medicines and denied any adverse effects.  He is not on a heart healthy diet despite an LDL of 162.5 in October 2013. He believes a maternal uncle may have had a heart attack in his 54s. There is no family history of stroke  He says he does restrict salt. He has not been monitoring his blood pressure. He has no exercise program.  Additionally in  random glucoses have varied from 100-110. He is unsure of any family history of diabetes.  He continues to smoke a pack a day.   Review of Systems    Chest pain, palpitations, tachycardia, exertional dyspnea, paroxysmal nocturnal dyspnea, claudication or edema are absent.        Objective:   Physical Exam Thin but healthy and well-nourished & in no acute distress  No carotid bruits are present.No neck pain distention present at 10 - 15 degrees. Thyroid normal to palpation  Heart rhythm and rate are normal with no gallop or murmur  Chest is clear with no increased work of breathing  There is no evidence of aortic aneurysm or renal artery bruits  Abdomen soft with no organomegaly or masses. No HJR  No clubbing, cyanosis or edema present.  Pedal pulses are intact   No ischemic skin changes are present . Fingernails healthy   Alert and oriented. Strength, tone, DTRs reflexes normal          Assessment & Plan:  See Current Assessment & Plan in Problem List under specific Diagnosis

## 2014-04-18 NOTE — Progress Notes (Signed)
Pre visit review using our clinic review tool, if applicable. No additional management support is needed unless otherwise documented below in the visit note. 

## 2014-04-18 NOTE — Patient Instructions (Signed)
Your next office appointment will be determined based upon review of your pending labs . Those instructions will be transmitted to you through My Chart Minimal Blood Pressure Goal= AVERAGE < 140/90;  Ideal is an AVERAGE < 135/85. This AVERAGE should be calculated from @ least 5-7 BP readings taken @ different times of day on different days of week. You should not respond to isolated BP readings , but rather the AVERAGE for that week .Please bring your  blood pressure cuff to office visits to verify that it is reliable.It  can also be checked against the blood pressure device at the pharmacy. Finger or wrist cuffs are not dependable; an arm cuff is. Cardiovascular exercise, this can be as simple a program as walking, is recommended 30-45 minutes 3-4 times per week. If you're not exercising you should take 6-8 weeks to build up to this level. Please think about quitting smoking. Review the risks we discussed. Please call 1-800-QUIT-NOW ([phone] 155-8580) for free smoking cessation counseling. There are multiple options are to help you stop smoking. These include nicotine patches, nicotine gum, and the new "E cigarette".

## 2014-04-18 NOTE — Assessment & Plan Note (Signed)
Risk discussed 

## 2014-05-04 ENCOUNTER — Other Ambulatory Visit: Payer: Self-pay | Admitting: Internal Medicine

## 2014-05-04 ENCOUNTER — Other Ambulatory Visit (INDEPENDENT_AMBULATORY_CARE_PROVIDER_SITE_OTHER): Payer: 59

## 2014-05-04 DIAGNOSIS — R739 Hyperglycemia, unspecified: Secondary | ICD-10-CM

## 2014-05-04 DIAGNOSIS — I10 Essential (primary) hypertension: Secondary | ICD-10-CM

## 2014-05-04 DIAGNOSIS — E785 Hyperlipidemia, unspecified: Secondary | ICD-10-CM

## 2014-05-04 LAB — TSH: TSH: 1.84 u[IU]/mL (ref 0.35–4.50)

## 2014-05-04 LAB — BASIC METABOLIC PANEL
BUN: 12 mg/dL (ref 6–23)
CHLORIDE: 104 meq/L (ref 96–112)
CO2: 30 mEq/L (ref 19–32)
CREATININE: 1 mg/dL (ref 0.4–1.5)
Calcium: 9.4 mg/dL (ref 8.4–10.5)
GFR: 105.65 mL/min (ref >60.00)
GLUCOSE: 102 mg/dL — AB (ref 70–99)
Potassium: 4.4 mEq/L (ref 3.5–5.1)
Sodium: 139 mEq/L (ref 135–145)

## 2014-05-04 LAB — HEMOGLOBIN A1C: HEMOGLOBIN A1C: 6.2 % (ref 4.6–6.5)

## 2014-05-06 LAB — NMR LIPOPROFILE WITH LIPIDS
CHOLESTEROL, TOTAL: 219 mg/dL — AB (ref 100–199)
HDL PARTICLE NUMBER: 33.2 umol/L (ref >=30.5)
HDL Size: 8.6 nm — ABNORMAL LOW (ref >=9.2)
HDL-C: 47 mg/dL (ref >39)
LARGE HDL: 3.2 umol/L — AB (ref >=4.8)
LARGE VLDL-P: 1.5 nmol/L (ref <=2.7)
LDL (calc): 153 mg/dL — ABNORMAL HIGH (ref 0–99)
LDL Particle Number: 1962 nmol/L — ABNORMAL HIGH (ref <1000)
LDL Size: 20.8 nm (ref >=20.8)
LP-IR Score: 59 — ABNORMAL HIGH (ref <=45)
Small LDL Particle Number: 990 nmol/L — ABNORMAL HIGH (ref <=527)
Triglycerides: 96 mg/dL (ref 0–149)
VLDL Size: 45.3 nm (ref <=46.6)

## 2014-05-07 ENCOUNTER — Encounter: Payer: Self-pay | Admitting: Internal Medicine

## 2014-05-30 ENCOUNTER — Encounter: Payer: Self-pay | Admitting: Internal Medicine

## 2014-05-30 ENCOUNTER — Ambulatory Visit (INDEPENDENT_AMBULATORY_CARE_PROVIDER_SITE_OTHER): Payer: 59 | Admitting: Internal Medicine

## 2014-05-30 VITALS — BP 138/84 | HR 80 | Temp 98.6°F | Resp 18 | Ht 67.0 in | Wt 147.0 lb

## 2014-05-30 DIAGNOSIS — R1310 Dysphagia, unspecified: Secondary | ICD-10-CM

## 2014-05-30 DIAGNOSIS — E785 Hyperlipidemia, unspecified: Secondary | ICD-10-CM

## 2014-05-30 DIAGNOSIS — Z0000 Encounter for general adult medical examination without abnormal findings: Secondary | ICD-10-CM

## 2014-05-30 DIAGNOSIS — F17210 Nicotine dependence, cigarettes, uncomplicated: Secondary | ICD-10-CM

## 2014-05-30 DIAGNOSIS — I10 Essential (primary) hypertension: Secondary | ICD-10-CM

## 2014-05-30 DIAGNOSIS — Z720 Tobacco use: Secondary | ICD-10-CM

## 2014-05-30 MED ORDER — NICOTINE 14 MG/24HR TD PT24: 14.0000 mg | MEDICATED_PATCH | Freq: Every day | TRANSDERMAL | Status: DC

## 2014-05-30 MED ORDER — HYDROCHLOROTHIAZIDE 12.5 MG PO CAPS: 12.5000 mg | ORAL_CAPSULE | Freq: Every day | ORAL | Status: DC

## 2014-05-30 MED ORDER — VERAPAMIL HCL ER 180 MG PO TBCR: 180.0000 mg | EXTENDED_RELEASE_TABLET | Freq: Every day | ORAL | Status: DC

## 2014-05-30 NOTE — Assessment & Plan Note (Signed)
Continues to use nexium as needed. Still with some dysphagia but not bothersome to him at this time.

## 2014-05-30 NOTE — Progress Notes (Signed)
   Subjective:    Patient ID: Matthew Roberts, male    DOB: [birth year], 53 y.o.   MRN: [id number]  HPI The patient is a 53 YO man who is coming in to establish care. He has PMH of tobacco abuse, HTN, hyperlipidemia, GERD (with dysphagia). He is working on quitting smoking and is down to about 1/2 PPD. He has tried nicotine patches but they are expensive. He denies chest pains, SOB, abdominal pain. He denies joint pain or swelling, balance problems.   Review of Systems  Constitutional: Negative for fever, activity change, appetite change, fatigue and unexpected weight change.  HENT: Negative.   Eyes: Negative.   Respiratory: Negative for cough, chest tightness, shortness of breath and wheezing.   Cardiovascular: Negative for chest pain, palpitations and leg swelling.  Gastrointestinal: Negative for abdominal pain, diarrhea, constipation and abdominal distention.  Musculoskeletal: Negative.   Skin: Negative.   Neurological: Negative for dizziness, weakness, numbness and headaches.  Psychiatric/Behavioral: Negative.       Objective:   Physical Exam  Constitutional: He is oriented to person, place, and time. He appears well-developed and well-nourished. No distress.  HENT:  Head: Normocephalic and atraumatic.  Eyes: EOM are normal.  Neck: Normal range of motion.  Cardiovascular: Normal rate and regular rhythm.   Pulmonary/Chest: Effort normal and breath sounds normal. No respiratory distress. He has no wheezes. He has no rales.  Abdominal: Soft. Bowel sounds are normal. He exhibits no distension. There is no tenderness. There is no rebound.  Neurological: He is alert and oriented to person, place, and time. Coordination normal.  Skin: Skin is warm and dry.   Filed Vitals:   05/30/14 0804  BP: 138/84  Pulse: 80  Temp: 98.6 F (37 C)  TempSrc: Oral  Resp: 18  Height: 5\' 7"  (1.702 m)  Weight: 147 lb (66.679 kg)  SpO2: 99%      Assessment & Plan:

## 2014-05-30 NOTE — Assessment & Plan Note (Signed)
BP well controlled today on HCTZ 12.5 mg daily and verapamil 180 mg daily. Recent BMP reviewed and normal.

## 2014-05-30 NOTE — Assessment & Plan Note (Signed)
He is working with diet and exercise to be healthier. Will recheck lipid panel next year. Reviewed lipid panel from last month.

## 2014-05-30 NOTE — Progress Notes (Signed)
Pre visit review using our clinic review tool, if applicable. No additional management support is needed unless otherwise documented below in the visit note. 

## 2014-05-30 NOTE — Assessment & Plan Note (Signed)
Patient already had flu shot, talked about smoking cessation and regular exercise.

## 2014-05-30 NOTE — Patient Instructions (Signed)
We have sent in the nicotine patches to the outpatient pharmacy to see if they will pay for them. Work on quitting smoking to help protect your lungs.   We have sent in refills for the blood pressure medicine and if you need refills please feel free to call our office.   Come back in about a year for a check up, please call our office with any questions or problems before then.   Smoking Cessation, Tips for Success If you are ready to quit smoking, congratulations! You have chosen to help yourself be healthier. Cigarettes bring nicotine, tar, carbon monoxide, and other irritants into your body. Your lungs, heart, and blood vessels will be able to work better without these poisons. There are many different ways to quit smoking. Nicotine gum, nicotine patches, a nicotine inhaler, or nicotine nasal spray can help with physical craving. Hypnosis, support groups, and medicines help break the habit of smoking. WHAT THINGS CAN I DO TO MAKE QUITTING EASIER?  Here are some tips to help you quit for good:  Pick a date when you will quit smoking completely. Tell all of your friends and family about your plan to quit on that date.  Do not try to slowly cut down on the number of cigarettes you are smoking. Pick a quit date and quit smoking completely starting on that day.  Throw away all cigarettes.   Clean and remove all ashtrays from your home, work, and car.  On a card, write down your reasons for quitting. Carry the card with you and read it when you get the urge to smoke.  Cleanse your body of nicotine. Drink enough water and fluids to keep your urine clear or pale yellow. Do this after quitting to flush the nicotine from your body.  Learn to predict your moods. Do not let a bad situation be your excuse to have a cigarette. Some situations in your life might tempt you into wanting a cigarette.  Never have "just one" cigarette. It leads to wanting another and another. Remind yourself of your  decision to quit.  Change habits associated with smoking. If you smoked while driving or when feeling stressed, try other activities to replace smoking. Stand up when drinking your coffee. Brush your teeth after eating. Sit in a different chair when you read the paper. Avoid alcohol while trying to quit, and try to drink fewer caffeinated beverages. Alcohol and caffeine may urge you to smoke.  Avoid foods and drinks that can trigger a desire to smoke, such as sugary or spicy foods and alcohol.  Ask people who smoke not to smoke around you.  Have something planned to do right after eating or having a cup of coffee. For example, plan to take a walk or exercise.  Try a relaxation exercise to calm you down and decrease your stress. Remember, you may be tense and nervous for the first 2 weeks after you quit, but this will pass.  Find new activities to keep your hands busy. Play with a pen, coin, or rubber band. Doodle or draw things on paper.  Brush your teeth right after eating. This will help cut down on the craving for the taste of tobacco after meals. You can also try mouthwash.   Use oral substitutes in place of cigarettes. Try using lemon drops, carrots, cinnamon sticks, or chewing gum. Keep them handy so they are available when you have the urge to smoke.  When you have the urge to smoke, try deep  breathing.  Designate your home as a nonsmoking area.  If you are a heavy smoker, ask your health care provider about a prescription for nicotine chewing gum. It can ease your withdrawal from nicotine.  Reward yourself. Set aside the cigarette money you save and buy yourself something nice.  Look for support from others. Join a support group or smoking cessation program. Ask someone at home or at work to help you with your plan to quit smoking.  Always ask yourself, "Do I need this cigarette or is this just a reflex?" Tell yourself, "Today, I choose not to smoke," or "I do not want to smoke."  You are reminding yourself of your decision to quit.  Do not replace cigarette smoking with electronic cigarettes (commonly called e-cigarettes). The safety of e-cigarettes is unknown, and some may contain harmful chemicals.  If you relapse, do not give up! Plan ahead and think about what you will do the next time you get the urge to smoke. HOW WILL I FEEL WHEN I QUIT SMOKING? You may have symptoms of withdrawal because your body is used to nicotine (the addictive substance in cigarettes). You may crave cigarettes, be irritable, feel very hungry, cough often, get headaches, or have difficulty concentrating. The withdrawal symptoms are only temporary. They are strongest when you first quit but will go away within 10-14 days. When withdrawal symptoms occur, stay in control. Think about your reasons for quitting. Remind yourself that these are signs that your body is healing and getting used to being without cigarettes. Remember that withdrawal symptoms are easier to treat than the major diseases that smoking can cause.  Even after the withdrawal is over, expect periodic urges to smoke. However, these cravings are generally short lived and will go away whether you smoke or not. Do not smoke! WHAT RESOURCES ARE AVAILABLE TO HELP ME QUIT SMOKING? Your health care provider can direct you to community resources or hospitals for support, which may include:  Group support.  Education.  Hypnosis.  Therapy. Document Released: 04/18/2004 Document Revised: 12/05/2013 Document Reviewed: 01/06/2013 [facility] Patient Information 2015 [place], [place]. This information is not intended to replace advice given to you by your health care provider. Make sure you discuss any questions you have with your health care provider.

## 2014-05-30 NOTE — Assessment & Plan Note (Signed)
Sent in rx for nicotine patches today and spoke with him about risks of smoking and benefits of quitting. He is motivated and trying to cut back already. Given tips for success.

## 2014-11-20 ENCOUNTER — Encounter: Payer: Self-pay | Admitting: Internal Medicine

## 2014-11-20 DIAGNOSIS — I10 Essential (primary) hypertension: Secondary | ICD-10-CM

## 2014-11-23 MED ORDER — SILDENAFIL CITRATE 50 MG PO TABS: 50.0000 mg | ORAL_TABLET | Freq: Every day | ORAL | Status: DC | PRN

## 2014-11-30 MED ORDER — VERAPAMIL HCL ER 180 MG PO TBCR
180.0000 mg | EXTENDED_RELEASE_TABLET | Freq: Every day | ORAL | Status: DC
Start: 2014-11-30 — End: 2015-12-11

## 2014-11-30 NOTE — Addendum Note (Signed)
Addended by: Vertell Novak A on: 11/30/2014 08:19 AM   Modules accepted: Orders

## 2014-12-01 ENCOUNTER — Other Ambulatory Visit: Payer: Self-pay | Admitting: Internal Medicine

## 2014-12-01 MED ORDER — TADALAFIL 10 MG PO TABS: 10.0000 mg | ORAL_TABLET | Freq: Every day | ORAL | Status: DC | PRN

## 2015-04-18 ENCOUNTER — Emergency Department (HOSPITAL_COMMUNITY)
Admission: EM | Admit: 2015-04-18 | Discharge: 2015-04-18 | Disposition: A | Discharge status: Home / Self Care | Payer: 59 | Source: Home / Self Care | Attending: Family Medicine | Admitting: Family Medicine

## 2015-04-18 ENCOUNTER — Encounter (HOSPITAL_COMMUNITY): Payer: Self-pay | Admitting: Emergency Medicine

## 2015-04-18 DIAGNOSIS — K115 Sialolithiasis: Secondary | ICD-10-CM

## 2015-04-18 NOTE — ED Provider Notes (Signed)
CSN: [id number]     Arrival date & time 04/18/15  1304 History   First MD Initiated Contact with Patient 04/18/15 1330     Chief Complaint  Patient presents with  . Lymphadenopathy   (Consider location/radiation/quality/duration/timing/severity/associated sxs/prior Treatment) Patient is a 54 y.o. male presenting with mouth sores. The history is provided by the patient.  Mouth Lesions Location:  Below tongue Quality:  Painful Pain details:    Severity:  Mild   Duration:  1 day   Progression:  Unchanged Onset quality:  Gradual Severity:  Mild Chronicity:  New Relieved by:  None tried Worsened by:  Nothing tried Ineffective treatments:  None tried Associated symptoms: swollen glands   Associated symptoms: no fever     Past Medical History  Diagnosis Date  . GERD (gastroesophageal reflux disease)   . History of epistaxis     required cauterization '08  . Fracture of thumb, left, closed may '06  . Esophageal dysmotility     Dx barium swallow.   . Hypertension    Past Surgical History  Procedure Laterality Date  . Hiatal hernia repair  2010   Family History  Problem Relation Age of Onset  . Dementia Mother   . Cancer Neg Hx   . COPD Neg Hx   . Drug abuse Neg Hx   . Hyperlipidemia Neg Hx   . Hypertension Neg Hx   . Pancreatic cancer Neg Hx   . Stomach cancer Neg Hx   . Rectal cancer Neg Hx   . Colon cancer Neg Hx   . Esophageal cancer Neg Hx   . Prostate cancer Neg Hx    Social History  Substance Use Topics  . Smoking status: Current Every Day Smoker -- 0.50 packs/day for 30 years    Types: Cigarettes  . Smokeless tobacco: Never Used     Comment: 10 cigs per day  . Alcohol Use: 3.5 oz/week    7 drink(s) per week    Review of Systems  Constitutional: Negative.  Negative for fever.  HENT: Positive for mouth sores. Negative for dental problem and facial swelling.   All other systems reviewed and are negative.   Allergies  Review of patient's allergies  indicates no known allergies.  Home Medications   Prior to Admission medications   Medication Sig Start Date End Date Taking? Authorizing Provider  aspirin 81 MG chewable tablet Chew 81 mg by mouth daily.    Historical Provider, MD  esomeprazole (NEXIUM) 40 MG capsule Take 1 capsule (40 mg total) by mouth daily. 05/27/12   Neena Rhymes, MD  hydrochlorothiazide (MICROZIDE) 12.5 MG capsule Take 1 capsule (12.5 mg total) by mouth daily. 05/30/14   Olga Millers, MD  nicotine (NICODERM CQ - DOSED IN MG/24 HOURS) 14 mg/24hr patch Place 1 patch (14 mg total) onto the skin daily. 05/30/14   Olga Millers, MD  sildenafil (VIAGRA) 50 MG tablet Take 1 tablet (50 mg total) by mouth daily as needed for erectile dysfunction. 11/23/14   Olga Millers, MD  tadalafil (CIALIS) 10 MG tablet Take 1 tablet (10 mg total) by mouth daily as needed for erectile dysfunction. 12/01/14   Olga Millers, MD  verapamil (CALAN-SR) 180 MG CR tablet Take 1 tablet (180 mg total) by mouth at bedtime. 11/30/14   Olga Millers, MD   Meds Ordered and Administered this Visit  Medications - No data to display  BP 134/93 mmHg  Pulse 87  Temp(Src) 99 F (  37.2 C) (Oral)  Resp 20  SpO2 100% No data found.   Physical Exam  Constitutional: He is oriented to person, place, and time. He appears well-developed and well-nourished. No distress.  HENT:  Head: Normocephalic.  Right Ear: External ear normal.  Left Ear: External ear normal.  Mouth/Throat: Oropharynx is clear and moist.  Right sublingual gland swelling with sl tenderness, visible stone at sublingual duct under right side of tongue.   Eyes: Conjunctivae are normal. Pupils are equal, round, and reactive to light.  Neurological: He is alert and oriented to person, place, and time.  Skin: Skin is warm and dry.  Nursing note and vitals reviewed.   ED Course  INCISION AND DRAINAGE Date/Time: 04/18/2015 2:02 PM Performed by: Billy Fischer Authorized by: Ihor Gully D Consent: Verbal consent obtained. Consent given by: patient Time out: Immediately prior to procedure a "time out" was called to verify the correct patient, procedure, equipment, support staff and site/side marked as required. Indications for incision and drainage: sublingual duct stone. Body area: mouth Location details: sublingual Scalpel size: 11 Incision type: single straight Complexity: simple Drainage characteristics: copious saliva expressed from duct. Drainage amount: copious Wound treatment: wound left open Patient tolerance: Patient tolerated the procedure well with no immediate complications Comments: Stone from duct expressed.   (including critical care time)  Labs Review Labs Reviewed - No data to display  Imaging Review No results found.   Visual Acuity Review  Right Eye Distance:   Left Eye Distance:   Bilateral Distance:    Right Eye Near:   Left Eye Near:    Bilateral Near:         MDM   1. Stone, salivary duct        Billy Fischer, MD 04/18/15 [phone]

## 2015-04-18 NOTE — Discharge Instructions (Signed)
Lemon candy and lots of fluids, return as needed.

## 2015-04-18 NOTE — ED Notes (Signed)
Noticed swelling to right side of neck.  Visible and palpable fullness.  Area is soft to touch.  Patient denies pain.  Patient denies cough, cold, or runny nose.  Patient reports this same issue last year, but cannot recall diagnosis ar what medicine he was given.  No airway involvement.

## 2015-09-03 MED FILL — VERAPAMIL ER 180 MG TABLET: 180 | 90 days supply | Qty: 90 | Fill #1

## 2015-12-11 ENCOUNTER — Other Ambulatory Visit: Payer: Self-pay | Admitting: Internal Medicine

## 2015-12-11 MED FILL — VERAPAMIL ER 180 MG TABLET: 180 | 90 days supply | Qty: 90 | Fill #0

## 2016-03-25 MED FILL — VERAPAMIL ER 180 MG TABLET: 180 | 90 days supply | Qty: 90 | Fill #1

## 2016-05-21 ENCOUNTER — Ambulatory Visit (INDEPENDENT_AMBULATORY_CARE_PROVIDER_SITE_OTHER): Payer: 59 | Admitting: Internal Medicine

## 2016-05-21 ENCOUNTER — Other Ambulatory Visit (INDEPENDENT_AMBULATORY_CARE_PROVIDER_SITE_OTHER): Payer: 59

## 2016-05-21 ENCOUNTER — Encounter: Payer: Self-pay | Admitting: Internal Medicine

## 2016-05-21 VITALS — BP 142/78 | HR 93 | Temp 98.6°F | Resp 16 | Ht 67.0 in | Wt 150.4 lb

## 2016-05-21 DIAGNOSIS — F17210 Nicotine dependence, cigarettes, uncomplicated: Secondary | ICD-10-CM | POA: Diagnosis not present

## 2016-05-21 DIAGNOSIS — R739 Hyperglycemia, unspecified: Secondary | ICD-10-CM | POA: Diagnosis not present

## 2016-05-21 DIAGNOSIS — I10 Essential (primary) hypertension: Secondary | ICD-10-CM | POA: Diagnosis not present

## 2016-05-21 DIAGNOSIS — Z0000 Encounter for general adult medical examination without abnormal findings: Secondary | ICD-10-CM

## 2016-05-21 LAB — CBC
HCT: 40.6 % (ref 39.0–52.0)
Hemoglobin: 13.8 g/dL (ref 13.0–17.0)
MCHC: 33.9 g/dL (ref 30.0–36.0)
MCV: 93.1 fl (ref 78.0–100.0)
Platelets: 336 10*3/uL (ref 150.0–400.0)
RBC: 4.36 Mil/uL (ref 4.22–5.81)
RDW: 14.7 % (ref 11.5–15.5)
WBC: 9.8 10*3/uL (ref 4.0–10.5)

## 2016-05-21 LAB — HEMOGLOBIN A1C: HEMOGLOBIN A1C: 6.1 % (ref 4.6–6.5)

## 2016-05-21 LAB — COMPREHENSIVE METABOLIC PANEL
ALT: 14 U/L (ref 0–53)
AST: 16 U/L (ref 0–37)
Albumin: 4.7 g/dL (ref 3.5–5.2)
Alkaline Phosphatase: 50 U/L (ref 39–117)
BUN: 12 mg/dL (ref 6–23)
CHLORIDE: 104 meq/L (ref 96–112)
CO2: 29 meq/L (ref 19–32)
CREATININE: 0.88 mg/dL (ref 0.40–1.50)
Calcium: 9.9 mg/dL (ref 8.4–10.5)
GFR: 114.56 mL/min (ref >60.00)
Glucose, Bld: 86 mg/dL (ref 70–99)
Potassium: 4.4 mEq/L (ref 3.5–5.1)
SODIUM: 140 meq/L (ref 135–145)
Total Bilirubin: 0.7 mg/dL (ref 0.2–1.2)
Total Protein: 7.9 g/dL (ref 6.0–8.3)

## 2016-05-21 LAB — LIPID PANEL
CHOL/HDL RATIO: 5
Cholesterol: 229 mg/dL — ABNORMAL HIGH (ref 0–200)
HDL: 43.5 mg/dL (ref >39.00)
LDL CALC: 160 mg/dL — AB (ref 0–99)
NonHDL: 185.6
Triglycerides: 128 mg/dL (ref 0.0–149.0)
VLDL: 25.6 mg/dL (ref 0.0–40.0)

## 2016-05-21 NOTE — Assessment & Plan Note (Signed)
Checking HgA1c today and adjust as needed.  

## 2016-05-21 NOTE — Progress Notes (Signed)
Pre visit review using our clinic review tool, if applicable. No additional management support is needed unless otherwise documented below in the visit note. 

## 2016-05-21 NOTE — Assessment & Plan Note (Signed)
Declines hep C and HIV screening. Checking labs. Counseled about the need for exercise and smoking cessation. Counseled about the dangers of distracted driving. Given screening recommendation. Flu and tetanus up to date. Colonoscopy up to date.

## 2016-05-21 NOTE — Assessment & Plan Note (Signed)
He is asked to monitor BP at home. Taking verapamil only. Checking CMP and adjust as needed.

## 2016-05-21 NOTE — Assessment & Plan Note (Signed)
He is smoking more than prior visit and did not use the patches. He will check with his work for their cessation program to try. He is wanting to quit but not sure about when. Reminded about the risks to his health from smoking.

## 2016-05-21 NOTE — Patient Instructions (Signed)
We are checking the labs today and will send the results on mychart.   Health Maintenance, Male A healthy lifestyle and preventative care can promote health and wellness.  Maintain regular health, dental, and eye exams.  Eat a healthy diet. Foods like vegetables, fruits, whole grains, low-fat dairy products, and lean protein foods contain the nutrients you need and are low in calories. Decrease your intake of foods high in solid fats, added sugars, and salt. Get information about a proper diet from your health care provider, if necessary.  Regular physical exercise is one of the most important things you can do for your health. Most adults should get at least 150 minutes of moderate-intensity exercise (any activity that increases your heart rate and causes you to sweat) each week. In addition, most adults need muscle-strengthening exercises on 2 or more days a week.   Maintain a healthy weight. The body mass index (BMI) is a screening tool to identify possible weight problems. It provides an estimate of body fat based on height and weight. Your health care provider can find your BMI and can help you achieve or maintain a healthy weight. For males 20 years and older:  A BMI below 18.5 is considered underweight.  A BMI of 18.5 to 24.9 is normal.  A BMI of 25 to 29.9 is considered overweight.  A BMI of 30 and above is considered obese.  Maintain normal blood lipids and cholesterol by exercising and minimizing your intake of saturated fat. Eat a balanced diet with plenty of fruits and vegetables. Blood tests for lipids and cholesterol should begin at age 1 and be repeated every 5 years. If your lipid or cholesterol levels are high, you are over age 2, or you are at high risk for heart disease, you may need your cholesterol levels checked more frequently.Ongoing high lipid and cholesterol levels should be treated with medicines if diet and exercise are not working.  If you smoke, find out from  your health care provider how to quit. If you do not use tobacco, do not start.  Lung cancer screening is recommended for adults aged 44-80 years who are at high risk for developing lung cancer because of a history of smoking. A yearly low-dose CT scan of the lungs is recommended for people who have at least a 30-pack-year history of smoking and are current smokers or have quit within the past 15 years. A pack year of smoking is smoking an average of 1 pack of cigarettes a day for 1 year (for example, a 30-pack-year history of smoking could mean smoking 1 pack a day for 30 years or 2 packs a day for 15 years). Yearly screening should continue until the smoker has stopped smoking for at least 15 years. Yearly screening should be stopped for people who develop a health problem that would prevent them from having lung cancer treatment.  If you choose to drink alcohol, do not have more than 2 drinks per day. One drink is considered to be 12 oz (360 mL) of beer, 5 oz (150 mL) of wine, or 1.5 oz (45 mL) of liquor.  Avoid the use of street drugs. Do not share needles with anyone. Ask for help if you need support or instructions about stopping the use of drugs.  High blood pressure causes heart disease and increases the risk of stroke. High blood pressure is more likely to develop in:  People who have blood pressure in the end of the normal range (100-139/85-89 mm  Hg).  People who are overweight or obese.  People who are African American.  If you are 39-51 years of age, have your blood pressure checked every 3-5 years. If you are 49 years of age or older, have your blood pressure checked every year. You should have your blood pressure measured twice--once when you are at a hospital or clinic, and once when you are not at a hospital or clinic. Record the average of the two measurements. To check your blood pressure when you are not at a hospital or clinic, you can use:  An automated blood pressure machine  at a pharmacy.  A home blood pressure monitor.  If you are 65-35 years old, ask your health care provider if you should take aspirin to prevent heart disease.  Diabetes screening involves taking a blood sample to check your fasting blood sugar level. This should be done once every 3 years after age 55 if you are at a normal weight and without risk factors for diabetes. Testing should be considered at a younger age or be carried out more frequently if you are overweight and have at least 1 risk factor for diabetes.  Colorectal cancer can be detected and often prevented. Most routine colorectal cancer screening begins at the age of 8 and continues through age 52. However, your health care provider may recommend screening at an earlier age if you have risk factors for colon cancer. On a yearly basis, your health care provider may provide home test kits to check for hidden blood in the stool. A small camera at the end of a tube may be used to directly examine the colon (sigmoidoscopy or colonoscopy) to detect the earliest forms of colorectal cancer. Talk to your health care provider about this at age 64 when routine screening begins. A direct exam of the colon should be repeated every 5-10 years through age 64, unless early forms of precancerous polyps or small growths are found.  People who are at an increased risk for hepatitis B should be screened for this virus. You are considered at high risk for hepatitis B if:  You were born in a country where hepatitis B occurs often. Talk with your health care provider about which countries are considered high risk.  Your parents were born in a high-risk country and you have not received a shot to protect against hepatitis B (hepatitis B vaccine).  You have HIV or AIDS.  You use needles to inject street drugs.  You live with, or have sex with, someone who has hepatitis B.  You are a man who has sex with other men (MSM).  You get hemodialysis  treatment.  You take certain medicines for conditions like cancer, organ transplantation, and autoimmune conditions.  Hepatitis C blood testing is recommended for all people born from [birth year] through 1965 and any individual with known risk factors for hepatitis C.  Healthy men should no longer receive prostate-specific antigen (PSA) blood tests as part of routine cancer screening. Talk to your health care provider about prostate cancer screening.  Testicular cancer screening is not recommended for adolescents or adult males who have no symptoms. Screening includes self-exam, a health care provider exam, and other screening tests. Consult with your health care provider about any symptoms you have or any concerns you have about testicular cancer.  Practice safe sex. Use condoms and avoid high-risk sexual practices to reduce the spread of sexually transmitted infections (STIs).  You should be screened for STIs, including gonorrhea and  chlamydia if:  You are sexually active and are younger than 24 years.  You are older than 24 years, and your health care provider tells you that you are at risk for this type of infection.  Your sexual activity has changed since you were last screened, and you are at an increased risk for chlamydia or gonorrhea. Ask your health care provider if you are at risk.  If you are at risk of being infected with HIV, it is recommended that you take a prescription medicine daily to prevent HIV infection. This is called pre-exposure prophylaxis (PrEP). You are considered at risk if:  You are a man who has sex with other men (MSM).  You are a heterosexual man who is sexually active with multiple partners.  You take drugs by injection.  You are sexually active with a partner who has HIV.  Talk with your health care provider about whether you are at high risk of being infected with HIV. If you choose to begin PrEP, you should first be tested for HIV. You should then be tested  every 3 months for as long as you are taking PrEP.  Use sunscreen. Apply sunscreen liberally and repeatedly throughout the day. You should seek shade when your shadow is shorter than you. Protect yourself by wearing long sleeves, pants, a wide-brimmed hat, and sunglasses year round whenever you are outdoors.  Tell your health care provider of new moles or changes in moles, especially if there is a change in shape or color. Also, tell your health care provider if a mole is larger than the size of a pencil eraser.  A one-time screening for abdominal aortic aneurysm (AAA) and surgical repair of large AAAs by ultrasound is recommended for men aged 22-75 years who are current or former smokers.  Stay current with your vaccines (immunizations).   This information is not intended to replace advice given to you by your health care provider. Make sure you discuss any questions you have with your health care provider.   Document Released: 01/17/2008 Document Revised: 08/11/2014 Document Reviewed: 12/16/2010 Elsevier Interactive Patient Education [employer].

## 2016-05-21 NOTE — Progress Notes (Signed)
   Subjective:    Patient ID: Matthew Roberts, male    DOB: 12-16-1960, 55 y.o.   MRN: [id number]  HPI The patient is a 55 YO man coming in for wellness. No new concerns. Not taking his hctz anymore as he ran out of refills.   PMH, [facility], social history reviewed and updated.   Review of Systems  Constitutional: Negative for activity change, appetite change, fatigue, fever and unexpected weight change.  HENT: Negative.   Eyes: Negative.   Respiratory: Positive for cough. Negative for chest tightness, shortness of breath and wheezing.   Cardiovascular: Negative.   Gastrointestinal: Negative.   Musculoskeletal: Negative.   Skin: Negative.   Neurological: Negative.   Psychiatric/Behavioral: Negative.       Objective:   Physical Exam  Constitutional: He is oriented to person, place, and time. He appears well-developed and well-nourished.  HENT:  Head: Normocephalic and atraumatic.  Eyes: EOM are normal.  Neck: Normal range of motion.  Cardiovascular: Normal rate and regular rhythm.   Pulmonary/Chest: Effort normal. No respiratory distress. He has no wheezes. He has no rales.  Abdominal: Soft. Bowel sounds are normal. He exhibits no distension. There is no tenderness. There is no rebound.  Musculoskeletal: He exhibits no edema.  Neurological: He is alert and oriented to person, place, and time. Coordination normal.  Skin: Skin is warm and dry.  Psychiatric: He has a normal mood and affect.   Vitals:   05/21/16 1015 05/21/16 1039  BP: (!) 148/92 (!) 142/78  Pulse: 93   Resp: 16   Temp: 98.6 F (37 C)   TempSrc: Oral   SpO2: 98%   Weight: 150 lb 6.4 oz (68.2 kg)   Height: 5\' 7"  (1.702 m)       Assessment & Plan:

## 2016-06-03 ENCOUNTER — Encounter: Payer: Self-pay | Admitting: Internal Medicine

## 2016-06-03 MED ORDER — SIMVASTATIN 40 MG PO TABS: 40.0000 mg | ORAL_TABLET | Freq: Every day | ORAL | 3 refills | Status: DC

## 2016-06-06 MED ORDER — PRAVASTATIN SODIUM 40 MG PO TABS: 40.0000 mg | ORAL_TABLET | Freq: Every day | ORAL | 3 refills | Status: DC

## 2016-06-06 MED FILL — PRAVASTATIN NA 40 MG TAB: 40 | 90 days supply | Qty: 90 | Fill #0

## 2016-06-06 NOTE — Addendum Note (Signed)
Addended by: Pricilla Holm A on: 06/06/2016 12:58 PM   Modules accepted: Orders

## 2016-06-24 MED FILL — VERAPAMIL ER 180 MG TABLET: 180 | 90 days supply | Qty: 90 | Fill #2

## 2016-09-16 MED FILL — PRAVASTATIN NA 40 MG TAB: 40 | 90 days supply | Qty: 90 | Fill #1

## 2016-09-30 MED FILL — VERAPAMIL ER 180 MG TABLET: 180 | 90 days supply | Qty: 90 | Fill #3

## 2017-01-05 ENCOUNTER — Other Ambulatory Visit: Payer: Self-pay | Admitting: Internal Medicine

## 2017-01-05 MED FILL — PRAVASTATIN NA 40 MG TAB: 40 | 90 days supply | Qty: 90 | Fill #2

## 2017-01-05 MED FILL — VERAPAMIL ER 180 MG TABLET: 180 | 90 days supply | Qty: 90 | Fill #0

## 2017-04-15 MED FILL — PRAVASTATIN NA 40 MG TAB: 40 | 90 days supply | Qty: 90 | Fill #3

## 2017-04-15 MED FILL — VERAPAMIL ER 180 MG TABLET: 180 | 90 days supply | Qty: 90 | Fill #1

## 2017-04-24 ENCOUNTER — Encounter: Payer: Self-pay | Admitting: Family Medicine

## 2017-04-24 ENCOUNTER — Ambulatory Visit (INDEPENDENT_AMBULATORY_CARE_PROVIDER_SITE_OTHER): Payer: 59 | Admitting: Family Medicine

## 2017-04-24 ENCOUNTER — Other Ambulatory Visit (INDEPENDENT_AMBULATORY_CARE_PROVIDER_SITE_OTHER): Payer: 59

## 2017-04-24 VITALS — BP 144/98 | HR 87 | Temp 98.4°F | Ht 67.0 in | Wt 150.1 lb

## 2017-04-24 DIAGNOSIS — R221 Localized swelling, mass and lump, neck: Secondary | ICD-10-CM

## 2017-04-24 LAB — CBC WITH DIFFERENTIAL/PLATELET
BASOS ABS: 0.1 10*3/uL (ref 0.0–0.1)
BASOS PCT: 0.6 % (ref 0.0–3.0)
Eosinophils Absolute: 0.1 10*3/uL (ref 0.0–0.7)
Eosinophils Relative: 1 % (ref 0.0–5.0)
HEMATOCRIT: 40.1 % (ref 39.0–52.0)
Hemoglobin: 13.4 g/dL (ref 13.0–17.0)
LYMPHS ABS: 3.1 10*3/uL (ref 0.7–4.0)
Lymphocytes Relative: 36.6 % (ref 12.0–46.0)
MCHC: 33.4 g/dL (ref 30.0–36.0)
MCV: 93.4 fl (ref 78.0–100.0)
MONOS PCT: 10.1 % (ref 3.0–12.0)
Monocytes Absolute: 0.8 10*3/uL (ref 0.1–1.0)
NEUTROS ABS: 4.3 10*3/uL (ref 1.4–7.7)
NEUTROS PCT: 51.7 % (ref 43.0–77.0)
PLATELETS: 320 10*3/uL (ref 150.0–400.0)
RBC: 4.29 Mil/uL (ref 4.22–5.81)
RDW: 14.6 % (ref 11.5–15.5)
WBC: 8.4 10*3/uL (ref 4.0–10.5)

## 2017-04-24 MED ORDER — CEPHALEXIN 500 MG PO CAPS: 500.0000 mg | ORAL_CAPSULE | Freq: Two times a day (BID) | ORAL | 0 refills | Status: DC

## 2017-04-24 MED FILL — CEPHALEXIN 500 MG CAPSULE: 500 | 7 days supply | Qty: 14 | Fill #0

## 2017-04-24 NOTE — Patient Instructions (Addendum)
Thank you for coming in,   CT neck with contrast. Please call me back once you find out what your insurance covers.   Please take a probiotic with the antibiotic      Please feel free to call with any questions or concerns at any time, at [phone]. --Dr. Raeford Razor

## 2017-04-24 NOTE — Progress Notes (Signed)
Matthew Roberts - 56 y.o. male MRN [id number]  Date of birth: October 03, 1960  SUBJECTIVE:  Including CC & ROS.  Chief Complaint  Patient presents with  . Swelling in neck    Patient is here today C/O swelling in right side of neck x2 weeks.  It has worsened.  Only painful when he turns his head quickly.    Matthew Roberts is a 56 year old male that is presenting with right-sided facial mass. He reports his symptoms started about 2 weeks ago and have progressively gotten worse. When he eats the side of his face gets significantly larger. He has no significant pain associated with it. Denies any fevers or chills. Denies any discharge into his mouth. He has had 2 prior instances of similar occasions. One time he was prescribed with Keflex and the other time he had an incision performed. This started about 4 years ago and has been intermittent since that time. He has not had any imaging performed on this area. He doesn't have any pain with swallowing but it just feels troublesome.     Review of Systems  Constitutional: Negative for fever.  HENT: Positive for facial swelling. Negative for trouble swallowing.   Respiratory: Negative for cough.   Cardiovascular: Negative for chest pain.    HISTORY: Past Medical, Surgical, Social, and Family History Reviewed & Updated per EMR.   Pertinent Historical Findings include:  Past Medical History:  Diagnosis Date  . Esophageal dysmotility    Dx barium swallow.   . Fracture of thumb, left, closed may '06  . GERD (gastroesophageal reflux disease)   . History of epistaxis    required cauterization '08  . Hypertension     Past Surgical History:  Procedure Laterality Date  . HIATAL HERNIA REPAIR  2010    No Known Allergies  Family History  Problem Relation Age of Onset  . Dementia Mother   . Cancer Neg Hx   . COPD Neg Hx   . Drug abuse Neg Hx   . Hyperlipidemia Neg Hx   . Hypertension Neg Hx   . Pancreatic cancer Neg Hx   . Stomach cancer  Neg Hx   . Rectal cancer Neg Hx   . Colon cancer Neg Hx   . Esophageal cancer Neg Hx   . Prostate cancer Neg Hx      Social History   Social History  . Marital status: Single    Spouse name: N/A  . Number of children: N/A  . Years of education: 49   Occupational History  . floor tech [place]   Social History Main Topics  . Smoking status: Current Every Day Smoker    Packs/day: 0.50    Years: 30.00    Types: Cigarettes  . Smokeless tobacco: Never Used     Comment: 10 cigs per day  . Alcohol use 3.5 oz/week    7 Standard drinks or equivalent per week  . Drug use: No  . Sexual activity: Yes    Partners: Female   Other Topics Concern  . Not on file   Social History Narrative   [employer]. 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 Married - '82 - 28yrs/divorced. No children. Serially monogamous. Work - Engineer, maintenance (IT) at [employer] Lives alone. No pets.      PHYSICAL EXAM:  VS: BP (!) 144/98 (BP Location: Left Arm, Patient Position: Sitting, Cuff Size: Normal)   Pulse 87   Temp 98.4 F (36.9 C) (Oral)   Ht  5\' 7"  (1.702 m)   Wt 150 lb 1.9 oz (68.1 kg)   SpO2 99%   BMI 23.51 kg/m  Physical Exam Gen: NAD, alert, cooperative with exam, well-appearing ENT: normal lips, normal nasal mucosa, enlarged right sided submandibular gland, some tenderness to palpation, no expression observed in the mouth, no oral lesions, no signs of infection within the mouth Eye: normal EOM, normal conjunctiva and lids CV:  no edema, +2 pedal pulses   Resp: no accessory muscle use, non-labored,  Skin: no rashes, no areas of induration  Neuro: normal tone, normal sensation to touch Psych:  normal insight, alert and oriented MSK: Normal gait, normal strength      ASSESSMENT & PLAN:   Neck mass This seems to be a recurring theme for him over the past 4 years. Had a discussion with him today on treatment and ongoing flareups. Discussed the possibility obtaining a CT today. - We will refer to  ENT and Keflex was sent in. - CBC w/ diff

## 2017-04-24 NOTE — Assessment & Plan Note (Addendum)
This seems to be a recurring theme for him over the past 4 years. Had a discussion with him today on treatment and ongoing flareups. Discussed the possibility obtaining a CT today. - We will refer to ENT and Keflex was sent in. - CBC w/ diff

## 2017-04-27 ENCOUNTER — Encounter: Payer: Self-pay | Admitting: Internal Medicine

## 2017-05-01 ENCOUNTER — Encounter: Payer: Self-pay | Admitting: Internal Medicine

## 2017-05-01 ENCOUNTER — Ambulatory Visit (INDEPENDENT_AMBULATORY_CARE_PROVIDER_SITE_OTHER): Payer: 59 | Admitting: Internal Medicine

## 2017-05-01 DIAGNOSIS — R221 Localized swelling, mass and lump, neck: Secondary | ICD-10-CM

## 2017-05-01 MED ORDER — CLINDAMYCIN HCL 300 MG PO CAPS: 300.0000 mg | ORAL_CAPSULE | Freq: Three times a day (TID) | ORAL | 0 refills | Status: DC

## 2017-05-01 MED FILL — CLINDAMYCIN HCL 300 MG CAPS: 300 | 10 days supply | Qty: 30 | Fill #0

## 2017-05-01 NOTE — Progress Notes (Signed)
   Subjective:    Patient ID: Matthew Roberts, male    DOB: 16-Jan-1961, 56 y.o.   MRN: [id number]  HPI The patient is a 56 YO man coming in for right neck mass. He was seen for this about 1 week ago and given a round of keflex and referred to ENT. He has not heard about the ENT visit yet. He has had no change of the area. It is less tender to touch. Is painful with eating which is new in the last few days. It does expand dramatically while eating and some shooting pains to his mouth. Denies fevers or chills. No significant weight change but 2 pound loss while he has had this due to not being able to eat as much.   Review of Systems  Constitutional: Positive for appetite change and unexpected weight change. Negative for activity change, chills, fatigue and fever.  HENT: Positive for ear pain. Negative for congestion, dental problem, drooling, ear discharge, facial swelling, mouth sores, nosebleeds, postnasal drip, sinus pain, sinus pressure, sneezing, sore throat, trouble swallowing and voice change.        Neck swelling  Eyes: Negative.   Respiratory: Negative.   Cardiovascular: Negative.   Gastrointestinal: Negative.   Musculoskeletal: Negative.       Objective:   Physical Exam  Constitutional: He is oriented to person, place, and time. He appears well-developed and well-nourished.  HENT:  Head: Normocephalic and atraumatic.  Right Ear: External ear normal.  Left Ear: External ear normal.  Eyes: Pupils are equal, round, and reactive to light. EOM are normal.  Neck: Normal range of motion. Neck supple.  Hard 2 cm nodule on the right submandibular region, per patient expands with eating, no visible drainage in the mouth with expression, no abscess palpable  Cardiovascular: Normal rate and regular rhythm.   Pulmonary/Chest: Effort normal and breath sounds normal.  Abdominal: Soft.  Neurological: He is alert and oriented to person, place, and time.  Skin: Skin is warm and dry.    Vitals:   05/01/17 0846  BP: 138/88  Pulse: 88  Temp: 98.6 F (37 C)  TempSrc: Oral  SpO2: 99%  Weight: 148 lb (67.1 kg)  Height: 5\' 7"  (1.702 m)      Assessment & Plan:

## 2017-05-01 NOTE — Assessment & Plan Note (Addendum)
Will follow up on ENT referral. Failure to keflex so will change to clindamycin for 10 days. If no improvement in 2-3 days needs CT scan for evaluation. He is a smoker. Offered HIV screening which he declines today.

## 2017-05-01 NOTE — Patient Instructions (Signed)
We have sent in clindamycin to take for the area. Take 1 pill 3 times per day for 10 days.   If you are not noticing improvement in about 2-3 days call us back as that is when we should order the CT scan to check the area for why it is not getting better.   We will get you in with the ENT doctor.

## 2017-05-06 ENCOUNTER — Encounter: Payer: Self-pay | Admitting: Internal Medicine

## 2017-05-06 DIAGNOSIS — R221 Localized swelling, mass and lump, neck: Secondary | ICD-10-CM

## 2017-05-12 ENCOUNTER — Ambulatory Visit (INDEPENDENT_AMBULATORY_CARE_PROVIDER_SITE_OTHER)
Admission: RE | Admit: 2017-05-12 | Discharge: 2017-05-12 | Disposition: A | Discharge status: Home / Self Care | Payer: 59 | Source: Ambulatory Visit | Attending: Internal Medicine | Admitting: Internal Medicine

## 2017-05-12 DIAGNOSIS — R221 Localized swelling, mass and lump, neck: Secondary | ICD-10-CM | POA: Diagnosis not present

## 2017-05-12 MED ORDER — IOPAMIDOL (ISOVUE-300) INJECTION 61%
75.0000 mL | Freq: Once | INTRAVENOUS | Status: AC | PRN
  Administered 2017-05-12: 75 mL via INTRAVENOUS

## 2017-05-14 DIAGNOSIS — K115 Sialolithiasis: Secondary | ICD-10-CM | POA: Diagnosis not present

## 2017-05-27 DIAGNOSIS — K115 Sialolithiasis: Secondary | ICD-10-CM | POA: Diagnosis not present

## 2017-06-15 DIAGNOSIS — H524 Presbyopia: Secondary | ICD-10-CM | POA: Diagnosis not present

## 2017-06-15 DIAGNOSIS — H5203 Hypermetropia, bilateral: Secondary | ICD-10-CM | POA: Diagnosis not present

## 2017-07-23 ENCOUNTER — Other Ambulatory Visit: Payer: Self-pay | Admitting: Internal Medicine

## 2017-07-23 MED FILL — VERAPAMIL ER 180 MG TABLET: 180 | 30 days supply | Qty: 30 | Fill #0

## 2017-07-24 ENCOUNTER — Encounter: Payer: Self-pay | Admitting: Internal Medicine

## 2017-07-27 ENCOUNTER — Other Ambulatory Visit: Payer: Self-pay

## 2017-07-27 MED ORDER — VERAPAMIL HCL ER 180 MG PO TBCR: 180.0000 mg | EXTENDED_RELEASE_TABLET | Freq: Every day | ORAL | 0 refills | Status: DC

## 2017-08-25 ENCOUNTER — Encounter: Payer: Self-pay | Admitting: Internal Medicine

## 2017-08-25 ENCOUNTER — Other Ambulatory Visit (INDEPENDENT_AMBULATORY_CARE_PROVIDER_SITE_OTHER): Payer: 59

## 2017-08-25 ENCOUNTER — Ambulatory Visit (INDEPENDENT_AMBULATORY_CARE_PROVIDER_SITE_OTHER): Payer: 59 | Admitting: Internal Medicine

## 2017-08-25 VITALS — BP 140/88 | HR 77 | Temp 98.1°F | Ht 67.0 in | Wt 156.0 lb

## 2017-08-25 DIAGNOSIS — Z1159 Encounter for screening for other viral diseases: Secondary | ICD-10-CM | POA: Diagnosis not present

## 2017-08-25 DIAGNOSIS — I10 Essential (primary) hypertension: Secondary | ICD-10-CM

## 2017-08-25 DIAGNOSIS — Z0000 Encounter for general adult medical examination without abnormal findings: Secondary | ICD-10-CM | POA: Diagnosis not present

## 2017-08-25 DIAGNOSIS — F17210 Nicotine dependence, cigarettes, uncomplicated: Secondary | ICD-10-CM | POA: Diagnosis not present

## 2017-08-25 DIAGNOSIS — E785 Hyperlipidemia, unspecified: Secondary | ICD-10-CM | POA: Diagnosis not present

## 2017-08-25 LAB — CBC
HCT: 39.4 % (ref 39.0–52.0)
Hemoglobin: 13.4 g/dL (ref 13.0–17.0)
MCHC: 33.9 g/dL (ref 30.0–36.0)
MCV: 92.5 fl (ref 78.0–100.0)
Platelets: 297 10*3/uL (ref 150.0–400.0)
RBC: 4.26 Mil/uL (ref 4.22–5.81)
RDW: 14.7 % (ref 11.5–15.5)
WBC: 7.6 10*3/uL (ref 4.0–10.5)

## 2017-08-25 LAB — COMPREHENSIVE METABOLIC PANEL
ALBUMIN: 4.4 g/dL (ref 3.5–5.2)
ALT: 23 U/L (ref 0–53)
AST: 20 U/L (ref 0–37)
Alkaline Phosphatase: 49 U/L (ref 39–117)
BUN: 10 mg/dL (ref 6–23)
CHLORIDE: 103 meq/L (ref 96–112)
CO2: 29 mEq/L (ref 19–32)
CREATININE: 0.83 mg/dL (ref 0.40–1.50)
Calcium: 9.3 mg/dL (ref 8.4–10.5)
GFR: 122.02 mL/min (ref >60.00)
Glucose, Bld: 92 mg/dL (ref 70–99)
Potassium: 3.6 mEq/L (ref 3.5–5.1)
SODIUM: 139 meq/L (ref 135–145)
Total Bilirubin: 0.6 mg/dL (ref 0.2–1.2)
Total Protein: 7.1 g/dL (ref 6.0–8.3)

## 2017-08-25 LAB — HEMOGLOBIN A1C: Hgb A1c MFr Bld: 6.2 % (ref 4.6–6.5)

## 2017-08-25 LAB — LIPID PANEL
Cholesterol: 253 mg/dL — ABNORMAL HIGH (ref 0–200)
HDL: 49.6 mg/dL (ref >39.00)
LDL CALC: 184 mg/dL — AB (ref 0–99)
NONHDL: 203.66
Total CHOL/HDL Ratio: 5
Triglycerides: 99 mg/dL (ref 0.0–149.0)
VLDL: 19.8 mg/dL (ref 0.0–40.0)

## 2017-08-25 MED ORDER — PRAVASTATIN SODIUM 40 MG PO TABS: 40.0000 mg | ORAL_TABLET | Freq: Every day | ORAL | 3 refills | Status: DC

## 2017-08-25 MED ORDER — VERAPAMIL HCL ER 180 MG PO TBCR: 180.0000 mg | EXTENDED_RELEASE_TABLET | Freq: Every day | ORAL | 3 refills | Status: DC

## 2017-08-25 MED FILL — PRAVASTATIN NA 40 MG TAB: 40 | 90 days supply | Qty: 90 | Fill #0

## 2017-08-25 MED FILL — VERAPAMIL ER 180 MG TABLET: 180 | 90 days supply | Qty: 90 | Fill #0

## 2017-08-25 NOTE — Progress Notes (Signed)
   Subjective:    Patient ID: GAYLIN BULTHUIS, male    DOB: 08/24/1960, 57 y.o.   MRN: [id number]  HPI The patient is a 57 YO man coming in for physical. No new concerns. Has stopped smoking with patches in the last 2 weeks.   PMH, [facility], social history reviewed and updated.   Review of Systems  Constitutional: Negative.   HENT: Negative.   Eyes: Negative.   Respiratory: Negative for cough, chest tightness and shortness of breath.   Cardiovascular: Negative for chest pain, palpitations and leg swelling.  Gastrointestinal: Negative for abdominal distention, abdominal pain, constipation, diarrhea, nausea and vomiting.  Musculoskeletal: Negative.   Skin: Negative.   Neurological: Negative.   Psychiatric/Behavioral: Negative.       Objective:   Physical Exam  Constitutional: He is oriented to person, place, and time. He appears well-developed and well-nourished.  HENT:  Head: Normocephalic and atraumatic.  Eyes: EOM are normal.  Neck: Normal range of motion.  Cardiovascular: Normal rate and regular rhythm.  Pulmonary/Chest: Effort normal and breath sounds normal. No respiratory distress. He has no wheezes. He has no rales.  Abdominal: Soft. Bowel sounds are normal. He exhibits no distension. There is no tenderness. There is no rebound.  Musculoskeletal: He exhibits no edema.  Neurological: He is alert and oriented to person, place, and time. Coordination normal.  Skin: Skin is warm and dry.  Psychiatric: He has a normal mood and affect.   Vitals:   08/25/17 0853  BP: 140/88  Pulse: 77  Temp: 98.1 F (36.7 C)  TempSrc: Oral  SpO2: 100%  Weight: 156 lb (70.8 kg)  Height: 5\' 7"  (1.702 m)      Assessment & Plan:

## 2017-08-25 NOTE — Assessment & Plan Note (Signed)
BP at goal on verapamil. Refilled today and checking CMP and adjust if needed.

## 2017-08-25 NOTE — Assessment & Plan Note (Signed)
Time spent counseling about tobacco usage: 3 minutes. I have asked about smoking and is smoking less than usual. The patient is advised to quit. The patient is willing to quit. They would like to try to quit now with nicotine patches. We will follow up with them in 6-12 months and as needed prior.

## 2017-08-25 NOTE — Assessment & Plan Note (Signed)
Checking HIV and hep c screening. Colonoscopy due next year. Tetanus and flu up to date. Added to shingrix waiting list. Counseled on diet and exercise changes. Given screening recommendations.

## 2017-08-25 NOTE — Assessment & Plan Note (Signed)
Taking pravachol 40 mg daily and checking lipid panel and adjust as needed.

## 2017-08-25 NOTE — Patient Instructions (Signed)
We are checking the labs today. We have placed you on the waiting list for the shingles vaccine and you will get a call back about that.   Health Maintenance, Male A healthy lifestyle and preventive care is important for your health and wellness. Ask your health care provider about what schedule of regular examinations is right for you. What should I know about weight and diet? Eat a Healthy Diet  Eat plenty of vegetables, fruits, whole grains, low-fat dairy products, and lean protein.  Do not eat a lot of foods high in solid fats, added sugars, or salt.  Maintain a Healthy Weight Regular exercise can help you achieve or maintain a healthy weight. You should:  Do at least 150 minutes of exercise each week. The exercise should increase your heart rate and make you sweat (moderate-intensity exercise).  Do strength-training exercises at least twice a week.  Watch Your Levels of Cholesterol and Blood Lipids  Have your blood tested for lipids and cholesterol every 5 years starting at 59 years of age. If you are at high risk for heart disease, you should start having your blood tested when you are 59 years old. You may need to have your cholesterol levels checked more often if: ? Your lipid or cholesterol levels are high. ? You are older than 59 years of age. ? You are at high risk for heart disease.  What should I know about cancer screening? Many types of cancers can be detected early and may often be prevented. Lung Cancer  You should be screened every year for lung cancer if: ? You are a current smoker who has smoked for at least 30 years. ? You are a former smoker who has quit within the past 15 years.  Talk to your health care provider about your screening options, when you should start screening, and how often you should be screened.  Colorectal Cancer  Routine colorectal cancer screening usually begins at 59 years of age and should be repeated every 5-10 years until you are 59  years old. You may need to be screened more often if early forms of precancerous polyps or small growths are found. Your health care provider may recommend screening at an earlier age if you have risk factors for colon cancer.  Your health care provider may recommend using home test kits to check for hidden blood in the stool.  A small camera at the end of a tube can be used to examine your colon (sigmoidoscopy or colonoscopy). This checks for the earliest forms of colorectal cancer.  Prostate and Testicular Cancer  Depending on your age and overall health, your health care provider may do certain tests to screen for prostate and testicular cancer.  Talk to your health care provider about any symptoms or concerns you have about testicular or prostate cancer.  Skin Cancer  Check your skin from head to toe regularly.  Tell your health care provider about any new moles or changes in moles, especially if: ? There is a change in a mole's size, shape, or color. ? You have a mole that is larger than a pencil eraser.  Always use sunscreen. Apply sunscreen liberally and repeat throughout the day.  Protect yourself by wearing long sleeves, pants, a wide-brimmed hat, and sunglasses when outside.  What should I know about heart disease, diabetes, and high blood pressure?  If you are 69-71 years of age, have your blood pressure checked every 3-5 years. If you are 40 years of  age or older, have your blood pressure checked every year. You should have your blood pressure measured twice-once when you are at a hospital or clinic, and once when you are not at a hospital or clinic. Record the average of the two measurements. To check your blood pressure when you are not at a hospital or clinic, you can use: ? An automated blood pressure machine at a pharmacy. ? A home blood pressure monitor.  Talk to your health care provider about your target blood pressure.  If you are between 27-78 years old, ask your  health care provider if you should take aspirin to prevent heart disease.  Have regular diabetes screenings by checking your fasting blood sugar level. ? If you are at a normal weight and have a low risk for diabetes, have this test once every three years after the age of 19. ? If you are overweight and have a high risk for diabetes, consider being tested at a younger age or more often.  A one-time screening for abdominal aortic aneurysm (AAA) by ultrasound is recommended for men aged 8-75 years who are current or former smokers. What should I know about preventing infection? Hepatitis B If you have a higher risk for hepatitis B, you should be screened for this virus. Talk with your health care provider to find out if you are at risk for hepatitis B infection. Hepatitis C Blood testing is recommended for:  Everyone born from [birth year] through 1967.  Anyone with known risk factors for hepatitis C.  Sexually Transmitted Diseases (STDs)  You should be screened each year for STDs including gonorrhea and chlamydia if: ? You are sexually active and are younger than 59 years of age. ? You are older than 59 years of age and your health care provider tells you that you are at risk for this type of infection. ? Your sexual activity has changed since you were last screened and you are at an increased risk for chlamydia or gonorrhea. Ask your health care provider if you are at risk.  Talk with your health care provider about whether you are at high risk of being infected with HIV. Your health care provider may recommend a prescription medicine to help prevent HIV infection.  What else can I do?  Schedule regular health, dental, and eye exams.  Stay current with your vaccines (immunizations).  Do not use any tobacco products, such as cigarettes, chewing tobacco, and e-cigarettes. If you need help quitting, ask your health care provider.  Limit alcohol intake to no more than 2 drinks per day. One  drink equals 12 ounces of beer, 5 ounces of wine, or 1 ounces of hard liquor.  Do not use street drugs.  Do not share needles.  Ask your health care provider for help if you need support or information about quitting drugs.  Tell your health care provider if you often feel depressed.  Tell your health care provider if you have ever been abused or do not feel safe at home. This information is not intended to replace advice given to you by your health care provider. Make sure you discuss any questions you have with your health care provider. Document Released: 01/17/2008 Document Revised: 03/19/2016 Document Reviewed: 04/24/2015 Elsevier Interactive Patient Education  [employer].

## 2017-08-26 LAB — HIV ANTIBODY (ROUTINE TESTING W REFLEX): HIV: NONREACTIVE

## 2017-08-26 LAB — HEPATITIS C ANTIBODY
Hepatitis C Ab: NONREACTIVE
SIGNAL TO CUT-OFF: 0.61 (ref <1.00)

## 2017-10-03 DIAGNOSIS — H0015 Chalazion left lower eyelid: Secondary | ICD-10-CM | POA: Diagnosis not present

## 2017-11-05 ENCOUNTER — Telehealth: Payer: Self-pay

## 2017-11-05 NOTE — Telephone Encounter (Signed)
Left message asking patient to call back to schedule nurse visit to get first shingrix vaccine---let Alonie Gazzola,RN at [facility] know when appt is made so that both vaccines can be labeled 

## 2017-11-17 NOTE — Telephone Encounter (Signed)
Both vaccines labeled and placed in refrig 

## 2017-11-17 NOTE — Telephone Encounter (Signed)
Pt is scheduled for shingles vaccine on 11/19/17 at 10 am.

## 2017-11-19 ENCOUNTER — Ambulatory Visit (INDEPENDENT_AMBULATORY_CARE_PROVIDER_SITE_OTHER): Payer: 59

## 2017-11-19 DIAGNOSIS — Z23 Encounter for immunization: Secondary | ICD-10-CM | POA: Diagnosis not present

## 2017-12-16 MED FILL — VERAPAMIL ER 180 MG TABLET: 180 | 90 days supply | Qty: 90 | Fill #1

## 2017-12-16 MED FILL — PRAVASTATIN NA 40 MG TAB: 40 | 90 days supply | Qty: 90 | Fill #1

## 2018-02-19 ENCOUNTER — Ambulatory Visit (INDEPENDENT_AMBULATORY_CARE_PROVIDER_SITE_OTHER): Payer: 59

## 2018-02-19 DIAGNOSIS — Z299 Encounter for prophylactic measures, unspecified: Secondary | ICD-10-CM | POA: Diagnosis not present

## 2018-03-29 MED FILL — VERAPAMIL ER 180 MG TABLET: 180 | 90 days supply | Qty: 90 | Fill #2

## 2018-03-29 MED FILL — PRAVASTATIN NA 40 MG TAB: 40 | 90 days supply | Qty: 90 | Fill #2

## 2018-04-20 ENCOUNTER — Encounter: Payer: Self-pay | Admitting: Internal Medicine

## 2018-05-11 ENCOUNTER — Ambulatory Visit: Payer: 59 | Admitting: Internal Medicine

## 2018-05-11 ENCOUNTER — Other Ambulatory Visit (INDEPENDENT_AMBULATORY_CARE_PROVIDER_SITE_OTHER): Payer: 59

## 2018-05-11 ENCOUNTER — Encounter: Payer: Self-pay | Admitting: Internal Medicine

## 2018-05-11 VITALS — BP 120/90 | HR 90 | Temp 98.3°F | Ht 67.0 in | Wt 155.0 lb

## 2018-05-11 DIAGNOSIS — N401 Enlarged prostate with lower urinary tract symptoms: Secondary | ICD-10-CM

## 2018-05-11 DIAGNOSIS — R350 Frequency of micturition: Secondary | ICD-10-CM

## 2018-05-11 LAB — PSA: PSA: 1.24 ng/mL (ref 0.10–4.00)

## 2018-05-11 NOTE — Progress Notes (Signed)
   Subjective:    Patient ID: Matthew Roberts, male    DOB: 1960/11/05, 57 y.o.   MRN: [id number]  HPI The patient is a 57 YO man coming in for worries about his prostate. He has a friend who has recently been diagnosed with prostate cancer. He is wondering if he can get his numbers checked. Has some mild increase in frequency. Maybe wakes up at night once to urinate. No problems with hesitancy or starting and stopping stream. Denies pain with urination or blood with urination. Denies stomach or back pain.   Review of Systems  Constitutional: Negative.   HENT: Negative.   Eyes: Negative.   Respiratory: Negative for cough, chest tightness and shortness of breath.   Cardiovascular: Negative for chest pain, palpitations and leg swelling.  Gastrointestinal: Negative for abdominal distention, abdominal pain, constipation, diarrhea, nausea and vomiting.  Genitourinary: Positive for frequency.  Musculoskeletal: Negative.   Skin: Negative.   Neurological: Negative.   Psychiatric/Behavioral: Negative.       Objective:   Physical Exam  Constitutional: He is oriented to person, place, and time. He appears well-developed and well-nourished.  HENT:  Head: Normocephalic and atraumatic.  Eyes: EOM are normal.  Neck: Normal range of motion.  Cardiovascular: Normal rate and regular rhythm.  Pulmonary/Chest: Effort normal and breath sounds normal. No respiratory distress. He has no wheezes. He has no rales.  Abdominal: Soft. Bowel sounds are normal. He exhibits no distension. There is no tenderness. There is no rebound.  Musculoskeletal: He exhibits no edema.  Neurological: He is alert and oriented to person, place, and time. Coordination normal.  Skin: Skin is warm and dry.   Vitals:   05/11/18 0840  BP: 120/90  Pulse: 90  Temp: 98.3 F (36.8 C)  TempSrc: Oral  SpO2: 99%  Weight: 155 lb (70.3 kg)  Height: 5\' 7"  (1.702 m)      Assessment & Plan:

## 2018-05-11 NOTE — Assessment & Plan Note (Signed)
Checking PSA. We discussed the risks and benefits of testing and he elects to continue.

## 2018-05-11 NOTE — Patient Instructions (Signed)
We will check the labs today. 

## 2018-06-08 ENCOUNTER — Encounter: Payer: Self-pay | Admitting: Internal Medicine

## 2018-06-09 MED ORDER — TADALAFIL 10 MG PO TABS: 10.0000 mg | ORAL_TABLET | Freq: Every day | ORAL | 6 refills | Status: DC | PRN

## 2018-06-09 MED FILL — TADALAFIL 10 MG TABS: 10 | 90 days supply | Qty: 18 | Fill #0

## 2018-06-29 DIAGNOSIS — H524 Presbyopia: Secondary | ICD-10-CM | POA: Diagnosis not present

## 2018-06-29 DIAGNOSIS — H5203 Hypermetropia, bilateral: Secondary | ICD-10-CM | POA: Diagnosis not present

## 2018-07-08 MED FILL — VERAPAMIL ER 180 MG TABLET: 180 | 90 days supply | Qty: 90 | Fill #3

## 2018-07-08 MED FILL — PRAVASTATIN NA 40 MG TAB: 40 | 90 days supply | Qty: 90 | Fill #3

## 2018-10-14 ENCOUNTER — Other Ambulatory Visit: Payer: Self-pay | Admitting: Internal Medicine

## 2018-10-14 MED FILL — VERAPAMIL ER 180 MG CAPSULE: 180 | 90 days supply | Qty: 90 | Fill #0

## 2018-11-01 ENCOUNTER — Other Ambulatory Visit: Payer: Self-pay | Admitting: Internal Medicine

## 2018-11-01 MED FILL — PRAVASTATIN NA 40 MG TAB: 40 | 90 days supply | Qty: 90 | Fill #0

## 2018-11-08 ENCOUNTER — Encounter: Payer: 59 | Admitting: Internal Medicine

## 2018-12-10 ENCOUNTER — Encounter: Payer: Self-pay | Admitting: Gastroenterology

## 2019-01-10 ENCOUNTER — Encounter: Payer: 59 | Admitting: Internal Medicine

## 2019-01-26 ENCOUNTER — Other Ambulatory Visit: Payer: Self-pay | Admitting: Internal Medicine

## 2019-01-26 MED FILL — VERAPAMIL ER 180 MG CAPSULE: 180 | 90 days supply | Qty: 90 | Fill #0

## 2019-02-15 ENCOUNTER — Other Ambulatory Visit: Payer: Self-pay

## 2019-02-15 ENCOUNTER — Ambulatory Visit (INDEPENDENT_AMBULATORY_CARE_PROVIDER_SITE_OTHER): Payer: 59 | Admitting: Internal Medicine

## 2019-02-15 ENCOUNTER — Other Ambulatory Visit (INDEPENDENT_AMBULATORY_CARE_PROVIDER_SITE_OTHER): Payer: 59

## 2019-02-15 ENCOUNTER — Encounter: Payer: Self-pay | Admitting: Internal Medicine

## 2019-02-15 VITALS — BP 120/86 | HR 89 | Temp 98.9°F | Ht 67.0 in | Wt 152.0 lb

## 2019-02-15 DIAGNOSIS — R739 Hyperglycemia, unspecified: Secondary | ICD-10-CM

## 2019-02-15 DIAGNOSIS — Z0000 Encounter for general adult medical examination without abnormal findings: Secondary | ICD-10-CM

## 2019-02-15 DIAGNOSIS — E782 Mixed hyperlipidemia: Secondary | ICD-10-CM | POA: Diagnosis not present

## 2019-02-15 DIAGNOSIS — F17210 Nicotine dependence, cigarettes, uncomplicated: Secondary | ICD-10-CM | POA: Diagnosis not present

## 2019-02-15 DIAGNOSIS — I10 Essential (primary) hypertension: Secondary | ICD-10-CM | POA: Diagnosis not present

## 2019-02-15 LAB — COMPREHENSIVE METABOLIC PANEL
ALT: 17 U/L (ref 0–53)
AST: 16 U/L (ref 0–37)
Albumin: 4.7 g/dL (ref 3.5–5.2)
Alkaline Phosphatase: 51 U/L (ref 39–117)
BUN: 13 mg/dL (ref 6–23)
CO2: 27 mEq/L (ref 19–32)
Calcium: 9.3 mg/dL (ref 8.4–10.5)
Chloride: 105 mEq/L (ref 96–112)
Creatinine, Ser: 0.84 mg/dL (ref 0.40–1.50)
GFR: 112.66 mL/min (ref >60.00)
Glucose, Bld: 88 mg/dL (ref 70–99)
Potassium: 3.8 mEq/L (ref 3.5–5.1)
Sodium: 140 mEq/L (ref 135–145)
Total Bilirubin: 0.6 mg/dL (ref 0.2–1.2)
Total Protein: 7.8 g/dL (ref 6.0–8.3)

## 2019-02-15 LAB — LIPID PANEL
Cholesterol: 172 mg/dL (ref 0–200)
HDL: 40.1 mg/dL (ref >39.00)
LDL Cholesterol: 108 mg/dL — ABNORMAL HIGH (ref 0–99)
NonHDL: 132.15
Total CHOL/HDL Ratio: 4
Triglycerides: 119 mg/dL (ref 0.0–149.0)
VLDL: 23.8 mg/dL (ref 0.0–40.0)

## 2019-02-15 LAB — CBC
HCT: 38.8 % — ABNORMAL LOW (ref 39.0–52.0)
Hemoglobin: 13.1 g/dL (ref 13.0–17.0)
MCHC: 33.9 g/dL (ref 30.0–36.0)
MCV: 94.2 fl (ref 78.0–100.0)
Platelets: 301 10*3/uL (ref 150.0–400.0)
RBC: 4.12 Mil/uL — ABNORMAL LOW (ref 4.22–5.81)
RDW: 14.4 % (ref 11.5–15.5)
WBC: 8.1 10*3/uL (ref 4.0–10.5)

## 2019-02-15 LAB — HEMOGLOBIN A1C: Hgb A1c MFr Bld: 6 % (ref 4.6–6.5)

## 2019-02-15 NOTE — Assessment & Plan Note (Signed)
Time spent counseling about tobacco usage: 3 minutes. I have asked about smoking and is smoking same as usual. The patient is advised to quit. The patient is not willing to quit. They would like to try to quit in the next 12 months. We will follow up with them in 12 months.  

## 2019-02-15 NOTE — Assessment & Plan Note (Signed)
BP at goal on verapamil. Checking CMP and adjust as needed.

## 2019-02-15 NOTE — Assessment & Plan Note (Signed)
Checking lipid panel and adjust pravastatin 40 mg daily as needed.  

## 2019-02-15 NOTE — Assessment & Plan Note (Signed)
Checking HgA1c for monitoring.  

## 2019-02-15 NOTE — Patient Instructions (Signed)

## 2019-02-15 NOTE — Progress Notes (Signed)
   Subjective:   Patient ID: Matthew Roberts, male    DOB: 08-20-1960, 58 y.o.   MRN: [id number]  HPI The patient is a 58 YO man coming in for physical.   PMH, [place], social history reviewed and updated  Review of Systems  Constitutional: Negative.   HENT: Negative.   Eyes: Negative.   Respiratory: Negative for cough, chest tightness and shortness of breath.   Cardiovascular: Negative for chest pain, palpitations and leg swelling.  Gastrointestinal: Negative for abdominal distention, abdominal pain, constipation, diarrhea, nausea and vomiting.  Musculoskeletal: Negative.   Skin: Negative.   Neurological: Negative.   Psychiatric/Behavioral: Negative.     Objective:  Physical Exam Constitutional:      Appearance: He is well-developed.  HENT:     Head: Normocephalic and atraumatic.  Neck:     Musculoskeletal: Normal range of motion.  Cardiovascular:     Rate and Rhythm: Normal rate and regular rhythm.  Pulmonary:     Effort: Pulmonary effort is normal. No respiratory distress.     Breath sounds: Normal breath sounds. No wheezing or rales.  Abdominal:     General: Bowel sounds are normal. There is no distension.     Palpations: Abdomen is soft.     Tenderness: There is no abdominal tenderness. There is no rebound.  Musculoskeletal:        General: No tenderness.  Skin:    General: Skin is warm and dry.  Neurological:     Mental Status: He is alert and oriented to person, place, and time.     Coordination: Coordination normal.     Vitals:   02/15/19 1028  BP: 120/86  Pulse: 89  Temp: 98.9 F (37.2 C)  TempSrc: Oral  SpO2: 98%  Weight: 152 lb (68.9 kg)  Height: 5\' 7"  (1.702 m)    Assessment & Plan:

## 2019-02-15 NOTE — Assessment & Plan Note (Signed)
Flu shot yearly. Shingrix complete. Tetanus up to date. Colonoscopy up to date. Counseled about sun safety and mole surveillance. Counseled about the dangers of distracted driving. Given 10 year screening recommendations.

## 2019-03-10 ENCOUNTER — Other Ambulatory Visit: Payer: Self-pay | Admitting: Internal Medicine

## 2019-03-11 MED FILL — PRAVASTATIN NA 40 MG TAB: 40 | 90 days supply | Qty: 90 | Fill #0

## 2019-03-14 MED FILL — TADALAFIL 10 MG TABS: 10 | 90 days supply | Qty: 18 | Fill #1

## 2019-05-13 MED FILL — VERAPAMIL ER 180 MG CAPSULE: 180 | 90 days supply | Qty: 90 | Fill #0

## 2019-06-28 MED FILL — PRAVASTATIN NA 40 MG TAB: 40 | 90 days supply | Qty: 90 | Fill #1

## 2019-07-04 DIAGNOSIS — H2513 Age-related nuclear cataract, bilateral: Secondary | ICD-10-CM | POA: Diagnosis not present

## 2019-07-04 DIAGNOSIS — H524 Presbyopia: Secondary | ICD-10-CM | POA: Diagnosis not present

## 2019-07-04 DIAGNOSIS — H5203 Hypermetropia, bilateral: Secondary | ICD-10-CM | POA: Diagnosis not present

## 2019-08-24 MED FILL — VERAPAMIL ER 180 MG CAPSULE: 180 | 90 days supply | Qty: 90 | Fill #1

## 2019-09-20 ENCOUNTER — Other Ambulatory Visit: Payer: Self-pay

## 2019-09-20 ENCOUNTER — Encounter (HOSPITAL_COMMUNITY): Payer: Self-pay | Admitting: Family Medicine

## 2019-09-20 ENCOUNTER — Ambulatory Visit (HOSPITAL_COMMUNITY)
Admission: EM | Admit: 2019-09-20 | Discharge: 2019-09-20 | Disposition: A | Discharge status: Home / Self Care | Payer: 59 | Attending: Family Medicine | Admitting: Family Medicine

## 2019-09-20 DIAGNOSIS — B356 Tinea cruris: Secondary | ICD-10-CM

## 2019-09-20 MED ORDER — CLOTRIMAZOLE-BETAMETHASONE 1-0.05 % EX CREA: TOPICAL_CREAM | CUTANEOUS | 0 refills | Status: DC

## 2019-09-20 MED FILL — CLOTRIMAZOLE-BETAMETHASONE: 1-0.05 | 7 days supply | Qty: 15 | Fill #0

## 2019-09-20 NOTE — ED Provider Notes (Signed)
[name]    CSN: [id number] Arrival date & time: 09/20/19  [id number]      History   Chief Complaint Chief Complaint  Patient presents with  . Rash    HPI Matthew Roberts is a 59 y.o. male.   Patient is a 59 year old male that presents today for rash.  The rash is located to bilateral inner thighs and groin area.  The rash has been there for approximately 10 days and worsening.  Describes the rash as itchy but not painful.  He has been using topical hydrocortisone with some relief in the symptoms but the rash is still present. Denies any fever, joint pain. Denies any recent changes in lotions, detergents, foods or other possible irritants. No recent travel. Nobody else at home has the rash. Patient has been outside but denies any contact with plants or insects. No new foods or medications.   ROS per HPI      Past Medical History:  Diagnosis Date  . Esophageal dysmotility    Dx barium swallow.   . Fracture of thumb, left, closed may '06  . GERD (gastroesophageal reflux disease)   . History of epistaxis    required cauterization '08  . Hypertension     Patient Active Problem List   Diagnosis Date Noted  . Benign prostatic hyperplasia with urinary frequency 05/11/2018  . Essential hypertension, benign 06/15/2012  . Hyperlipidemia 05/27/2012  . Hyperglycemia 05/27/2012  . Cigarette smoker 05/27/2012  . Dysphagia 05/27/2012  . Routine health maintenance 05/27/2012    Past Surgical History:  Procedure Laterality Date  . HIATAL HERNIA REPAIR  2010       Home Medications    Prior to Admission medications   Medication Sig Start Date End Date Taking? Authorizing Provider  aspirin 81 MG chewable tablet Chew 81 mg by mouth daily.   Yes [provider]  pravastatin (PRAVACHOL) 40 MG tablet TAKE 1 TABLET BY MOUTH DAILY. 03/11/19  Yes Hoyt Koch, MD  verapamil (VERELAN PM) 180 MG 24 hr capsule TAKE 1 CAPSULE(180 MG TOTAL) BY MOUTH AT  BEDTIME 03/11/19  Yes Hoyt Koch, MD  clotrimazole-betamethasone (LOTRISONE) cream Apply to affected area 2 times daily prn 09/20/19   Loura Halt A, NP  tadalafil (CIALIS) 10 MG tablet Take 1 tablet (10 mg total) by mouth daily as needed for erectile dysfunction. 06/09/18   Hoyt Koch, MD    Family History Family History  Problem Relation Age of Onset  . Dementia Mother   . Cancer Neg Hx   . COPD Neg Hx   . Drug abuse Neg Hx   . Hyperlipidemia Neg Hx   . Hypertension Neg Hx   . Pancreatic cancer Neg Hx   . Stomach cancer Neg Hx   . Rectal cancer Neg Hx   . Colon cancer Neg Hx   . Esophageal cancer Neg Hx   . Prostate cancer Neg Hx     Social History Social History   Tobacco Use  . Smoking status: Current Every Day Smoker    Packs/day: 0.50    Years: 30.00    Pack years: 15.00    Types: Cigarettes  . Smokeless tobacco: Never Used  . Tobacco comment: 10 cigs per day  Substance Use Topics  . Alcohol use: Yes    Alcohol/week: 7.0 standard drinks    Types: 7 Standard drinks or equivalent per week  . Drug use: No     Allergies   Patient has  no known allergies.   Review of Systems Review of Systems   Physical Exam Triage Vital Signs ED Triage Vitals  Enc Vitals Group     BP 09/20/19 1025 (!) 149/96     Pulse Rate 09/20/19 1025 91     Resp 09/20/19 1025 16     Temp 09/20/19 1025 98.4 F (36.9 C)     Temp Source 09/20/19 1025 Oral     SpO2 09/20/19 1025 99 %     Weight --      Height --      Head Circumference --      Peak Flow --      Pain Score 09/20/19 1042 0     Pain Loc --      Pain Edu? --      Excl. in [place]? --    No data found.  Updated Vital Signs BP (!) 149/96 (BP Location: Left Arm)   Pulse 91   Temp 98.4 F (36.9 C) (Oral)   Resp 16   SpO2 99%   Visual Acuity Right Eye Distance:   Left Eye Distance:   Bilateral Distance:    Right Eye Near:   Left Eye Near:    Bilateral Near:     Physical Exam Vitals and  nursing note reviewed.  Constitutional:      Appearance: Normal appearance.  HENT:     Head: Normocephalic and atraumatic.     Nose: Nose normal.  Eyes:     Conjunctiva/sclera: Conjunctivae normal.  Pulmonary:     Effort: Pulmonary effort is normal.  Musculoskeletal:        General: Normal range of motion.     Cervical back: Normal range of motion.  Skin:    General: Skin is warm and dry.     Findings: Rash present.     Comments: widespread scaly, patchy rash to bilateral inner thighs with erythema.   Neurological:     Mental Status: He is alert.  Psychiatric:        Mood and Affect: Mood normal.      UC Treatments / Results  Labs (all labs ordered are listed, but only abnormal results are displayed) Labs Reviewed - No data to display  EKG   Radiology No results found.  Procedures Procedures (including critical care time)  Medications Ordered in UC Medications - No data to display  Initial Impression / Assessment and Plan / UC Course  I have reviewed the triage vital signs and the nursing notes.  Pertinent labs & imaging results that were available during my care of the patient were reviewed by me and considered in my medical decision making (see chart for details).     Tinea cruris- treating with Lotrisone cream.  Follow up as needed for continued or worsening symptoms  Final Clinical Impressions(s) / UC Diagnoses   Final diagnoses:  Tinea cruris     Discharge Instructions     Use the cream as prescribed  Keep area clean and dry.  Follow up as needed for continued or worsening symptoms     ED Prescriptions    Medication Sig Dispense Auth. Provider   clotrimazole-betamethasone (LOTRISONE) cream Apply to affected area 2 times daily prn 15 g Siriyah Ambrosius A, NP     PDMP not reviewed this encounter.   Orvan July, NP 09/21/19 [phone]

## 2019-09-20 NOTE — Discharge Instructions (Signed)
Use the cream as prescribed  Keep area clean and dry.  Follow up as needed for continued or worsening symptoms

## 2019-09-20 NOTE — ED Triage Notes (Signed)
Pt reports rash/itching to bilat inner thighs x approx 10 days. States topical hydrocortisone not improving sx. Denies abd pain, fever, chills or other complaint.

## 2019-10-12 MED FILL — PRAVASTATIN NA 40 MG TAB: 40 | 90 days supply | Qty: 90 | Fill #2

## 2019-11-29 MED FILL — VERAPAMIL ER 180 MG CAPSULE: 180 | 90 days supply | Qty: 90 | Fill #2

## 2019-12-27 DIAGNOSIS — H00024 Hordeolum internum left upper eyelid: Secondary | ICD-10-CM | POA: Diagnosis not present

## 2019-12-27 MED FILL — OFLOXACIN 0.3% EYE DROPS: 0.3 | 7 days supply | Qty: 5 | Fill #0

## 2020-01-16 MED FILL — PRAVASTATIN NA 40 MG TAB: 40 | 90 days supply | Qty: 90 | Fill #3

## 2020-01-20 DIAGNOSIS — H10501 Unspecified blepharoconjunctivitis, right eye: Secondary | ICD-10-CM | POA: Diagnosis not present

## 2020-01-20 MED FILL — TOBRAMYCIN-DEXAMETH OPHTH S: 0.3-0.1 | 12 days supply | Qty: 3 | Fill #0

## 2020-03-07 MED FILL — VERAPAMIL ER 180 MG CAPSULE: 180 | 90 days supply | Qty: 90 | Fill #3

## 2020-04-24 ENCOUNTER — Other Ambulatory Visit: Payer: Self-pay | Admitting: Internal Medicine

## 2020-04-24 MED FILL — PRAVASTATIN NA 40 MG TAB: 40 | 90 days supply | Qty: 90 | Fill #0

## 2020-05-15 ENCOUNTER — Other Ambulatory Visit (HOSPITAL_COMMUNITY): Payer: Self-pay

## 2020-05-15 MED FILL — CEPHALEXIN 500 MG CAPSULE: 500 | 7 days supply | Qty: 21 | Fill #0

## 2020-06-11 ENCOUNTER — Other Ambulatory Visit: Payer: Self-pay | Admitting: Internal Medicine

## 2020-06-13 ENCOUNTER — Other Ambulatory Visit: Payer: Self-pay

## 2020-06-13 MED ORDER — VERAPAMIL HCL ER 180 MG PO CP24: ORAL_CAPSULE | ORAL | 3 refills | Status: DC

## 2020-06-13 MED FILL — VERAPAMIL ER 180 MG CAPSULE: 180 | 90 days supply | Qty: 90 | Fill #0

## 2020-06-13 NOTE — Telephone Encounter (Signed)
    Patient calling to report he has no verapamil (VERELAN PM) 180 MG 24 hr capsule remaining

## 2020-07-03 DIAGNOSIS — H524 Presbyopia: Secondary | ICD-10-CM | POA: Diagnosis not present

## 2020-07-03 DIAGNOSIS — H5203 Hypermetropia, bilateral: Secondary | ICD-10-CM | POA: Diagnosis not present

## 2020-07-03 DIAGNOSIS — H2510 Age-related nuclear cataract, unspecified eye: Secondary | ICD-10-CM | POA: Diagnosis not present

## 2020-07-30 ENCOUNTER — Encounter: Payer: 59 | Admitting: Internal Medicine

## 2020-08-08 ENCOUNTER — Other Ambulatory Visit: Payer: Self-pay | Admitting: Internal Medicine

## 2020-08-08 MED FILL — PRAVASTATIN NA 40 MG TAB: 40 | 90 days supply | Qty: 90 | Fill #0

## 2020-08-20 ENCOUNTER — Encounter: Payer: 59 | Admitting: Internal Medicine

## 2020-09-05 ENCOUNTER — Other Ambulatory Visit: Payer: Self-pay | Admitting: Internal Medicine

## 2020-09-05 ENCOUNTER — Encounter: Payer: Self-pay | Admitting: Internal Medicine

## 2020-09-05 ENCOUNTER — Other Ambulatory Visit: Payer: Self-pay

## 2020-09-05 ENCOUNTER — Ambulatory Visit (INDEPENDENT_AMBULATORY_CARE_PROVIDER_SITE_OTHER): Payer: 59 | Admitting: Internal Medicine

## 2020-09-05 VITALS — BP 132/84 | HR 93 | Temp 98.4°F | Resp 18 | Ht 67.0 in | Wt 153.8 lb

## 2020-09-05 DIAGNOSIS — R1310 Dysphagia, unspecified: Secondary | ICD-10-CM

## 2020-09-05 DIAGNOSIS — Z0000 Encounter for general adult medical examination without abnormal findings: Secondary | ICD-10-CM | POA: Diagnosis not present

## 2020-09-05 DIAGNOSIS — E782 Mixed hyperlipidemia: Secondary | ICD-10-CM | POA: Diagnosis not present

## 2020-09-05 DIAGNOSIS — R739 Hyperglycemia, unspecified: Secondary | ICD-10-CM

## 2020-09-05 DIAGNOSIS — I10 Essential (primary) hypertension: Secondary | ICD-10-CM | POA: Diagnosis not present

## 2020-09-05 DIAGNOSIS — F17210 Nicotine dependence, cigarettes, uncomplicated: Secondary | ICD-10-CM | POA: Diagnosis not present

## 2020-09-05 LAB — LIPID PANEL
Cholesterol: 182 mg/dL (ref 0–200)
HDL: 48.3 mg/dL (ref >39.00)
LDL Cholesterol: 107 mg/dL — ABNORMAL HIGH (ref 0–99)
NonHDL: 133.92
Total CHOL/HDL Ratio: 4
Triglycerides: 136 mg/dL (ref 0.0–149.0)
VLDL: 27.2 mg/dL (ref 0.0–40.0)

## 2020-09-05 LAB — COMPREHENSIVE METABOLIC PANEL
ALT: 22 U/L (ref 0–53)
AST: 17 U/L (ref 0–37)
Albumin: 4.6 g/dL (ref 3.5–5.2)
Alkaline Phosphatase: 52 U/L (ref 39–117)
BUN: 14 mg/dL (ref 6–23)
CO2: 30 mEq/L (ref 19–32)
Calcium: 9.8 mg/dL (ref 8.4–10.5)
Chloride: 102 mEq/L (ref 96–112)
Creatinine, Ser: 0.86 mg/dL (ref 0.40–1.50)
GFR: 93.22 mL/min (ref >60.00)
Glucose, Bld: 91 mg/dL (ref 70–99)
Potassium: 3.7 mEq/L (ref 3.5–5.1)
Sodium: 138 mEq/L (ref 135–145)
Total Bilirubin: 0.5 mg/dL (ref 0.2–1.2)
Total Protein: 8 g/dL (ref 6.0–8.3)

## 2020-09-05 LAB — CBC
HCT: 38.7 % — ABNORMAL LOW (ref 39.0–52.0)
Hemoglobin: 13.1 g/dL (ref 13.0–17.0)
MCHC: 33.9 g/dL (ref 30.0–36.0)
MCV: 93.3 fl (ref 78.0–100.0)
Platelets: 332 10*3/uL (ref 150.0–400.0)
RBC: 4.15 Mil/uL — ABNORMAL LOW (ref 4.22–5.81)
RDW: 14.8 % (ref 11.5–15.5)
WBC: 8.1 10*3/uL (ref 4.0–10.5)

## 2020-09-05 MED ORDER — VERAPAMIL HCL ER 180 MG PO CP24: ORAL_CAPSULE | ORAL | 3 refills | Status: DC

## 2020-09-05 MED ORDER — ESOMEPRAZOLE MAGNESIUM 40 MG PO CPDR: 40.0000 mg | DELAYED_RELEASE_CAPSULE | Freq: Every day | ORAL | 3 refills | Status: DC

## 2020-09-05 MED FILL — VERAPAMIL ER 180 MG CAPSULE: 180 | 90 days supply | Qty: 90 | Fill #0

## 2020-09-05 MED FILL — ESOMEPRAZOLE MAG DR 40 MG C: 40 | 90 days supply | Qty: 90 | Fill #0

## 2020-09-05 NOTE — Progress Notes (Signed)
   Subjective:   Patient ID: Matthew Roberts, male    DOB: 07-20-1961, 60 y.o.   MRN: [id number]  HPI The patient is a 60 YO man coming in for physical.   PMH, [place], social history reviewed and updated  Review of Systems  Constitutional: Negative.   HENT: Negative.   Eyes: Negative.   Respiratory: Negative for cough, chest tightness and shortness of breath.   Cardiovascular: Negative for chest pain, palpitations and leg swelling.  Gastrointestinal: Negative for abdominal distention, abdominal pain, constipation, diarrhea, nausea and vomiting.  Musculoskeletal: Negative.   Skin: Negative.   Neurological: Negative.   Psychiatric/Behavioral: Negative.     Objective:  Physical Exam Constitutional:      Appearance: He is well-developed and well-nourished.  HENT:     Head: Normocephalic and atraumatic.  Eyes:     Extraocular Movements: EOM normal.  Cardiovascular:     Rate and Rhythm: Normal rate and regular rhythm.  Pulmonary:     Effort: Pulmonary effort is normal. No respiratory distress.     Breath sounds: Normal breath sounds. No wheezing or rales.  Abdominal:     General: Bowel sounds are normal. There is no distension.     Palpations: Abdomen is soft.     Tenderness: There is no abdominal tenderness. There is no rebound.  Musculoskeletal:        General: No edema.     Cervical back: Normal range of motion.  Skin:    General: Skin is warm and dry.  Neurological:     Mental Status: He is alert and oriented to person, place, and time.     Coordination: Coordination normal.  Psychiatric:        Mood and Affect: Mood and affect normal.     Vitals:   09/05/20 1003  BP: 132/84  Pulse: 93  Resp: 18  Temp: 98.4 F (36.9 C)  TempSrc: Oral  SpO2: 97%  Weight: 153 lb 12.8 oz (69.8 kg)  Height: 5\' 7"  (1.702 m)    This visit occurred during the SARS-CoV-2 public health emergency.  Safety protocols were in place, including screening questions prior to the visit,  additional usage of staff PPE, and extensive cleaning of exam room while observing appropriate contact time as indicated for disinfecting solutions.   Assessment & Plan:

## 2020-09-05 NOTE — Patient Instructions (Addendum)
Think about getting the covid-19 booster shot as this can help prevent catching covid-19.  We will get the screening for the blood vessel in the stomach.   We will get you in with the GI doctor and will have you start taking nexium again daily (we sent in the prescription).   Health Maintenance, Male Adopting a healthy lifestyle and getting preventive care are important in promoting health and wellness. Ask your health care provider about:  The right schedule for you to have regular tests and exams.  Things you can do on your own to prevent diseases and keep yourself healthy. What should I know about diet, weight, and exercise? Eat a healthy diet  Eat a diet that includes plenty of vegetables, fruits, low-fat dairy products, and lean protein.  Do not eat a lot of foods that are high in solid fats, added sugars, or sodium.   Maintain a healthy weight Body mass index (BMI) is a measurement that can be used to identify possible weight problems. It estimates body fat based on height and weight. Your health care provider can help determine your BMI and help you achieve or maintain a healthy weight. Get regular exercise Get regular exercise. This is one of the most important things you can do for your health. Most adults should:  Exercise for at least 150 minutes each week. The exercise should increase your heart rate and make you sweat (moderate-intensity exercise).  Do strengthening exercises at least twice a week. This is in addition to the moderate-intensity exercise.  Spend less time sitting. Even light physical activity can be beneficial. Watch cholesterol and blood lipids Have your blood tested for lipids and cholesterol at 62 years of age, then have this test every 5 years. You may need to have your cholesterol levels checked more often if:  Your lipid or cholesterol levels are high.  You are older than 62 years of age.  You are at high risk for heart disease. What should I  know about cancer screening? Many types of cancers can be detected early and may often be prevented. Depending on your health history and family history, you may need to have cancer screening at various ages. This may include screening for:  Colorectal cancer.  Prostate cancer.  Skin cancer.  Lung cancer. What should I know about heart disease, diabetes, and high blood pressure? Blood pressure and heart disease  High blood pressure causes heart disease and increases the risk of stroke. This is more likely to develop in people who have high blood pressure readings, are of African descent, or are overweight.  Talk with your health care provider about your target blood pressure readings.  Have your blood pressure checked: ? Every 3-5 years if you are 54-9 years of age. ? Every year if you are 57 years old or older.  If you are between the ages of 89 and 48 and are a current or former smoker, ask your health care provider if you should have a one-time screening for abdominal aortic aneurysm (AAA). Diabetes Have regular diabetes screenings. This checks your fasting blood sugar level. Have the screening done:  Once every three years after age 62 if you are at a normal weight and have a low risk for diabetes.  More often and at a younger age if you are overweight or have a high risk for diabetes. What should I know about preventing infection? Hepatitis B If you have a higher risk for hepatitis B, you should be screened  for this virus. Talk with your health care provider to find out if you are at risk for hepatitis B infection. Hepatitis C Blood testing is recommended for:  Everyone born from [birth year] through 1967.  Anyone with known risk factors for hepatitis C. Sexually transmitted infections (STIs)  You should be screened each year for STIs, including gonorrhea and chlamydia, if: ? You are sexually active and are younger than 62 years of age. ? You are older than 62 years of age and  your health care provider tells you that you are at risk for this type of infection. ? Your sexual activity has changed since you were last screened, and you are at increased risk for chlamydia or gonorrhea. Ask your health care provider if you are at risk.  Ask your health care provider about whether you are at high risk for HIV. Your health care provider may recommend a prescription medicine to help prevent HIV infection. If you choose to take medicine to prevent HIV, you should first get tested for HIV. You should then be tested every 3 months for as long as you are taking the medicine. Follow these instructions at home: Lifestyle  Do not use any products that contain nicotine or tobacco, such as cigarettes, e-cigarettes, and chewing tobacco. If you need help quitting, ask your health care provider.  Do not use street drugs.  Do not share needles.  Ask your health care provider for help if you need support or information about quitting drugs. Alcohol use  Do not drink alcohol if your health care provider tells you not to drink.  If you drink alcohol: ? Limit how much you have to 0-2 drinks a day. ? Be aware of how much alcohol is in your drink. In the [place] one drink equals one 12 oz bottle of beer (355 mL), one 5 oz glass of wine (148 mL), or one 1 oz glass of hard liquor (44 mL). General instructions  Schedule regular health, dental, and eye exams.  Stay current with your vaccines.  Tell your health care provider if: ? You often feel depressed. ? You have ever been abused or do not feel safe at home. Summary  Adopting a healthy lifestyle and getting preventive care are important in promoting health and wellness.  Follow your health care provider's instructions about healthy diet, exercising, and getting tested or screened for diseases.  Follow your health care provider's instructions on monitoring your cholesterol and blood pressure. This information is not intended to  replace advice given to you by your health care provider. Make sure you discuss any questions you have with your health care provider. Document Revised: 07/14/2018 Document Reviewed: 07/14/2018 Elsevier Patient Education  2021 [employer].

## 2020-09-07 NOTE — Assessment & Plan Note (Deleted)
BP at goal on verapamil. Checking CMP and adjust as needed.  

## 2020-09-07 NOTE — Assessment & Plan Note (Signed)
Counseled to quit and he feels unable to make attempt at this time. Ordered AAA screening as not done previously. Reminded about harms from cigarette smoking.

## 2020-09-07 NOTE — Assessment & Plan Note (Signed)
Flu shot up to date. Covid-19 2 shots encouraged booster. Shingrix complete. Tetanus up to date. Colonoscopy referral to GI placed as due this year. Counseled about sun safety and mole surveillance. Counseled about the dangers of distracted driving. Given 10 year screening recommendations.

## 2020-09-07 NOTE — Assessment & Plan Note (Signed)
Checking HgA1c. 

## 2020-09-07 NOTE — Assessment & Plan Note (Signed)
BP at goal on verapamil. Checking CMP and adjust as needed.  

## 2020-09-07 NOTE — Assessment & Plan Note (Signed)
Referral to GI for this and prior EGD before with problems.

## 2020-09-07 NOTE — Assessment & Plan Note (Signed)
Checking lipid panel and adjust pravastatin 40 mg daily as needed.  

## 2020-09-28 ENCOUNTER — Other Ambulatory Visit: Payer: Self-pay

## 2020-09-28 ENCOUNTER — Ambulatory Visit (HOSPITAL_COMMUNITY)
Admission: RE | Admit: 2020-09-28 | Discharge: 2020-09-28 | Disposition: A | Discharge status: Home / Self Care | Payer: 59 | Source: Ambulatory Visit | Attending: Internal Medicine | Admitting: Internal Medicine

## 2020-09-28 DIAGNOSIS — F17210 Nicotine dependence, cigarettes, uncomplicated: Secondary | ICD-10-CM | POA: Diagnosis not present

## 2020-11-14 ENCOUNTER — Other Ambulatory Visit: Payer: Self-pay | Admitting: Internal Medicine

## 2020-11-15 ENCOUNTER — Other Ambulatory Visit (HOSPITAL_COMMUNITY): Payer: Self-pay

## 2020-11-15 MED ORDER — PRAVASTATIN SODIUM 40 MG PO TABS
40.0000 mg | ORAL_TABLET | Freq: Every day | ORAL | 0 refills | Status: DC
  Filled 2020-11-15: qty 90, 90d supply, fill #0

## 2020-12-18 ENCOUNTER — Ambulatory Visit: Payer: 59 | Admitting: Gastroenterology

## 2020-12-18 ENCOUNTER — Other Ambulatory Visit (HOSPITAL_COMMUNITY): Payer: Self-pay

## 2020-12-18 ENCOUNTER — Encounter: Payer: Self-pay | Admitting: Gastroenterology

## 2020-12-18 VITALS — BP 144/84 | HR 100 | Ht 67.0 in | Wt 154.2 lb

## 2020-12-18 DIAGNOSIS — R1319 Other dysphagia: Secondary | ICD-10-CM | POA: Diagnosis not present

## 2020-12-18 DIAGNOSIS — Z86010 Personal history of colonic polyps: Secondary | ICD-10-CM

## 2020-12-18 MED ORDER — PLENVU 140 G PO SOLR
140.0000 g | ORAL | 0 refills | Status: DC
  Filled 2020-12-18: qty 3, 1d supply, fill #0

## 2020-12-18 NOTE — Progress Notes (Signed)
[facility] Consult Note:  History: Matthew Roberts 12/18/2020  Referring provider: Hoyt Koch, MD  Reason for consult/chief complaint: Dysphagia (Dysphagia to solid foods x several years, now worsening. Patient indicates that he had surgery for this in [facility] but is unable to elaborate on what type of surgery he had. No odynophagia. Patient indicates that he does have some burning in the esophagus at times though denies any food regurgitation. No nausea or vomiting. No abdominal pain.)   Subjective  HPI:  Matthew Roberts was sent by primary care to evaluate longstanding dysphagia. Very limited records are available from 2008, when he was probably seen by Dr. Michail Sermon at [facility] and then sent to [facility] for dysphagia and testing suggesting achalasia.  He says a surgery was done at that time, he seems to describe it as laparoscopic and does not know what it was.  Swallowing apparently improved for some time afterwards, but for at least the last decade he has had both solid and liquid dysphagia occurring nearly every day.  Some days it seems worse and he has to drink more fluids to get food to pass.  He has intermittent heartburn but denies regurgitation, no nausea or vomiting.  He denies abdominal pain, his bowel habits are regular and denies rectal bleeding.  Appetite good and weight stable  The dysphagia has not been progressive over many years and he has come to live with it.  He was on Nexium for a long period of time but did not feel that it helped anything so he stopped.  Last endoscopic procedure on file is a screening colonoscopy by Dr. Deatra Ina on 11/15/2013, at which time a sigmoid tubular adenoma was removed.  Total size of specimen 10 mm.  ROS:  Review of Systems  Constitutional: Negative for appetite change and unexpected weight change.  HENT: Negative for mouth sores and voice change.   Eyes: Negative for pain and redness.  Respiratory: Negative for  cough and shortness of breath.   Cardiovascular: Negative for chest pain and palpitations.  Genitourinary: Negative for dysuria and hematuria.  Musculoskeletal: Negative for arthralgias and myalgias.  Skin: Negative for pallor and rash.  Neurological: Negative for weakness and headaches.  Hematological: Negative for adenopathy.     Past Medical History: Past Medical History:  Diagnosis Date  . Esophageal dysmotility    Dx barium swallow.   . Fracture of thumb, left, closed may '06  . GERD (gastroesophageal reflux disease)   . History of epistaxis    required cauterization '08  . Hypertension      Past Surgical History: Past Surgical History:  Procedure Laterality Date  . HIATAL HERNIA REPAIR  2010   Details of the surgery are unknown at present  Family History: Family History  Problem Relation Age of Onset  . Dementia Mother   . Cancer Neg Hx   . COPD Neg Hx   . Drug abuse Neg Hx   . Hyperlipidemia Neg Hx   . Hypertension Neg Hx   . Pancreatic cancer Neg Hx   . Stomach cancer Neg Hx   . Rectal cancer Neg Hx   . Colon cancer Neg Hx   . Esophageal cancer Neg Hx   . Prostate cancer Neg Hx     Social History: Social History   Socioeconomic History  . Marital status: Single    Spouse name: Not on file  . Number of children: Not on file  . Years of education: 45  .  Highest education level: Not on file  Occupational History  . Occupation: Medical illustrator: [employer]  Tobacco Use  . Smoking status: Current Every Day Smoker    Packs/day: 0.50    Years: 30.00    Pack years: 15.00    Types: Cigarettes  . Smokeless tobacco: Never Used  . Tobacco comment: 10 cigs per day  Substance and Sexual Activity  . Alcohol use: Yes    Alcohol/week: 7.0 standard drinks    Types: 7 Standard drinks or equivalent per week  . Drug use: No  . Sexual activity: Yes    Partners: Female  Other Topics Concern  . Not on file  Social History Narrative   HSG. 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 Married - '82 - 29yrs/divorced. No children. Serially monogamous. Work - Engineer, maintenance (IT) at [employer] Lives alone. No pets.    Social Determinants of Health   Financial Resource Strain: Not on file  Food Insecurity: Not on file  Transportation Needs: Not on file  Physical Activity: Not on file  Stress: Not on file  Social Connections: Not on file   He works maintenance at [employer]: No Known Allergies  Outpatient Meds: Current Outpatient Medications  Medication Sig Dispense Refill  . aspirin 81 MG chewable tablet Chew 81 mg by mouth daily.          . pravastatin (PRAVACHOL) 40 MG tablet TAKE 1 TABLET (40 MG TOTAL) BY MOUTH DAILY. APPOINTMENT NEEDED FOR ADDITIONAL REFILLS 90 tablet 0  . tadalafil (CIALIS) 10 MG tablet Take 1 tablet (10 mg total) by mouth daily as needed for erectile dysfunction. 18 tablet 6  . verapamil (VERELAN PM) 180 MG 24 hr capsule TAKE 1 CAPSULE(180 MG TOTAL) BY MOUTH AT BEDTIME 90 capsule 3   No current facility-administered medications for this visit.      ___________________________________________________________________ Objective   Exam:  BP (!) 144/84   Pulse 100   Ht 5\' 7"  (1.702 m)   Wt 154 lb 3.2 oz (69.9 kg)   BMI 24.15 kg/m  Wt Readings from Last 3 Encounters:  12/18/20 154 lb 3.2 oz (69.9 kg)  09/05/20 153 lb 12.8 oz (69.8 kg)  02/15/19 152 lb (68.9 kg)     General: Well-appearing, conversational and pleasant, normal vocal quality, good muscle mass  Eyes: sclera anicteric, no redness  ENT: oral mucosa moist without lesions, no cervical or supraclavicular lymphadenopathy  CV: RRR without murmur, S1/S2, no JVD, no peripheral edema  Resp: clear to auscultation bilaterally, normal RR and effort noted  GI: soft, no tenderness, with active bowel sounds. No guarding or palpable organomegaly noted.  Skin; warm and dry, no rash or jaundice noted  Neuro: awake, alert and oriented x 3.  Normal gross motor function and fluent speech  Radiology:  September 2008 upper GI series was found in epic records:  Clinical Data:  Dysphagia    UPPER GI SERIES W/ HIGH-DENSITY BARIUM AND KUB:    Technique:  After obtaining a scout radiograph, a double-contrast upper GI  series was performed using both high-density and thin barium. Barium swallow was  also performed, including swallowing 13 mm barium tablet.    Findings:  Mucosal relief shots of the pharynx are unremarkable. Evaluation of  esophageal motility shows severe esophageal dysmotility, with near aperistalsis.  There is mild tapering of the distal esophagus without well-defined stricture or  mass. The 13 mm barium tablet did lodge in the  distal esophagus just above the  GE junction. This did not pass during the exam.    The stomach is unremarkable without mass, stricture, or ulceration. Duodenal  bulb and duodenal sweep are unremarkable.    IMPRESSION:    Severe esophageal dysmotility. Although this does not have the severe distal  tapering of typical achalasia, this could represent early achalasia. Collagen  vascular disorders can have a similar appearance.   Provider: Dario Guardian   Other:   The only record found in care everywhere was from December 2008, and encounter with [facility] (Dr. Kathryne Hitch [facility]), were upper endoscopy must of been done and this is the pathology report  Diagnosis: Esophagus, lower third, biopsy - Unremarkable squamous mucosa without intraepithelial eosinophils   Clinical History: 59 year old male with esophageal dysphagia and suspected achalasia. Upper GI endoscopy found abnormal motility noted at the gastroesophageal junction. There are no abnormalities seen within the esophagus. Biopsies were taken.   Gross Description: Received is one appropriately labeled container.  Specimen A:  SITE: Esophagus, lower third, rule out eosinophilic esophagitis METHOD: Biopsy MEASURE: 3  x 2 x 1 mm in aggregate COMMENT: Three small white soft tissue fragments BLOCK:  A1, NTR   ([place])  P.A.: Dondra Prader, MHS, PA(ASCP) lmf//08/03/2007  Assessment: Encounter Diagnoses  Name Primary?  . Esophageal dysphagia Yes  . Personal history of colonic polyps    His original diagnosis is uncertain, but records suggest it may have been achalasia.  The nature of his surgical procedure is unknown, but could have been a myotomy with fundoplication. He has many years of nonprogressive dysphagia even after surgery, suggesting he probably had a significant dysmotility before surgical intervention.  If achalasia was his diagnosis, those patients are at an increased risk of esophageal squamous carcinoma.   Plan:  EGD to evaluate dysphagia and surveillance colonoscopy.  He was agreeable after discussion of procedure and risks  The benefits and risks of the planned procedure were described in detail with the patient or (when appropriate) their health care proxy.  Risks were outlined as including, but not limited to, bleeding, infection, perforation, adverse medication reaction leading to cardiac or pulmonary decompensation, pancreatitis (if ERCP).  The limitation of incomplete mucosal visualization was also discussed.  No guarantees or warranties were given.  Upper GI series to evaluate upper GI anatomy, which will help with endoscopy planning.  Thank you for the courtesy of this consult.  Please call me with any questions or concerns.  Nelida Meuse III  CC: Referring provider noted above

## 2020-12-18 NOTE — Patient Instructions (Addendum)
If you are age 62 or older, your body mass index should be between 23-30. Your Body mass index is 24.15 kg/m. If this is out of the aforementioned range listed, please consider follow up with your Primary Care Provider.  If you are age 52 or younger, your body mass index should be between 19-25. Your Body mass index is 24.15 kg/m. If this is out of the aformentioned range listed, please consider follow up with your Primary Care Provider.   You have been scheduled for an endoscopy and colonoscopy. Please follow the written instructions given to you at your visit today. Please pick up your prep supplies at the pharmacy within the next 1-3 days. If you use inhalers (even only as needed), please bring them with you on the day of your procedure.   You have been scheduled for an Upper GI Series at [facility]. Your appointment is on 12-28-2020 at 11am. Please arrive 15 minutes prior to your test for registration. Make sure not to eat or drink anything after midnight on the night before your test. If you need to reschedule, please call radiology at [phone]. ________________________________________________________________ An upper GI series uses x rays to help diagnose problems of the upper GI tract, which includes the esophagus, stomach, and duodenum. The duodenum is the first part of the small intestine. An upper GI series is conducted by a radiology technologist or a radiologist--a doctor who specializes in x-ray imaging--at a hospital or outpatient center. While sitting or standing in front of an x-ray machine, the patient drinks barium liquid, which is often white and has a chalky consistency and taste. The barium liquid coats the lining of the upper GI tract and makes signs of disease show up more clearly on x rays. X-ray video, called fluoroscopy, is used to view the barium liquid moving through the esophagus, stomach, and duodenum. Additional x rays and fluoroscopy are performed while  the patient lies on an x-ray table. To fully coat the upper GI tract with barium liquid, the technologist or radiologist may press on the abdomen or ask the patient to change position. Patients hold still in various positions, allowing the technologist or radiologist to take x rays of the upper GI tract at different angles. If a technologist conducts the upper GI series, a radiologist will later examine the images to look for problems.  This test typically takes about 1 hour to complete. __________________________________________________________________  It was a pleasure to see you today!  Thank you for trusting me with your gastrointestinal care!

## 2020-12-26 ENCOUNTER — Other Ambulatory Visit (HOSPITAL_COMMUNITY): Payer: Self-pay

## 2020-12-26 MED FILL — Verapamil HCl Cap ER 24HR 180 MG: ORAL | 90 days supply | Qty: 90 | Fill #0 | Status: AC

## 2020-12-27 ENCOUNTER — Other Ambulatory Visit (HOSPITAL_COMMUNITY): Payer: Self-pay

## 2020-12-28 ENCOUNTER — Other Ambulatory Visit (HOSPITAL_COMMUNITY): Payer: 59

## 2021-01-11 ENCOUNTER — Other Ambulatory Visit: Payer: Self-pay

## 2021-01-11 ENCOUNTER — Ambulatory Visit (HOSPITAL_COMMUNITY)
Admission: RE | Admit: 2021-01-11 | Discharge: 2021-01-11 | Disposition: A | Discharge status: Home / Self Care | Payer: 59 | Source: Ambulatory Visit | Attending: Gastroenterology | Admitting: Gastroenterology

## 2021-01-11 DIAGNOSIS — Z86010 Personal history of colonic polyps: Secondary | ICD-10-CM | POA: Insufficient documentation

## 2021-01-11 DIAGNOSIS — R1319 Other dysphagia: Secondary | ICD-10-CM | POA: Diagnosis not present

## 2021-01-11 DIAGNOSIS — R1310 Dysphagia, unspecified: Secondary | ICD-10-CM | POA: Diagnosis not present

## 2021-02-20 ENCOUNTER — Ambulatory Visit (AMBULATORY_SURGERY_CENTER): Payer: 59 | Admitting: Gastroenterology

## 2021-02-20 ENCOUNTER — Other Ambulatory Visit: Payer: Self-pay

## 2021-02-20 ENCOUNTER — Encounter: Payer: Self-pay | Admitting: Gastroenterology

## 2021-02-20 VITALS — BP 129/78 | HR 88 | Temp 99.3°F | Resp 23 | Ht 67.0 in | Wt 154.0 lb

## 2021-02-20 DIAGNOSIS — R1319 Other dysphagia: Secondary | ICD-10-CM

## 2021-02-20 DIAGNOSIS — Z86010 Personal history of colonic polyps: Secondary | ICD-10-CM

## 2021-02-20 DIAGNOSIS — K2100 Gastro-esophageal reflux disease with esophagitis, without bleeding: Secondary | ICD-10-CM | POA: Diagnosis not present

## 2021-02-20 DIAGNOSIS — R1310 Dysphagia, unspecified: Secondary | ICD-10-CM | POA: Diagnosis not present

## 2021-02-20 MED ORDER — SODIUM CHLORIDE 0.9 % IV SOLN: 500.0000 mL | Freq: Once | INTRAVENOUS | Status: DC

## 2021-02-20 NOTE — Patient Instructions (Signed)
The office will call you with two appointment.  An Esophageal Manometry and also a Gastric Emptying Study. You may resume your current medications today. After Dr. Loletha Carrow sees the results of test, he will later reschedule the colonoscopy. Please call if any questions or concerns.       YOU HAD AN ENDOSCOPIC PROCEDURE TODAY AT [facility]:   Refer to the procedure report that was given to you for any specific questions about what was found during the examination.  If the procedure report does not answer your questions, please call your gastroenterologist to clarify.  If you requested that your care partner not be given the details of your procedure findings, then the procedure report has been included in a sealed envelope for you to review at your convenience later.  YOU SHOULD EXPECT: Some feelings of bloating in the abdomen. Passage of more gas than usual.  Walking can help get rid of the air that was put into your GI tract during the procedure and reduce the bloating. If you had a lower endoscopy (such as a colonoscopy or flexible sigmoidoscopy) you may notice spotting of blood in your stool or on the toilet paper. If you underwent a bowel prep for your procedure, you may not have a normal bowel movement for a few days.  Please Note:  You might notice some irritation and congestion in your nose or some drainage.  This is from the oxygen used during your procedure.  There is no need for concern and it should clear up in a day or so.  SYMPTOMS TO REPORT IMMEDIATELY:   Following upper endoscopy (EGD)  Vomiting of blood or coffee ground material  New chest pain or pain under the shoulder blades  Painful or persistently difficult swallowing  New shortness of breath  Fever of 100F or higher  Black, tarry-looking stools  For urgent or emergent issues, a gastroenterologist can be reached at any hour by calling [phone]. Do not use MyChart messaging for urgent concerns.     DIET:  We do recommend a small meal at first, but then you may proceed to your regular diet.  Drink plenty of fluids but you should avoid alcoholic beverages for 24 hours.  ACTIVITY:  You should plan to take it easy for the rest of today and you should NOT DRIVE or use heavy machinery until tomorrow (because of the sedation medicines used during the test).    FOLLOW UP: Our staff will call the number listed on your records 48-72 hours following your procedure to check on you and address any questions or concerns that you may have regarding the information given to you following your procedure. If we do not reach you, we will leave a message.  We will attempt to reach you two times.  During this call, we will ask if you have developed any symptoms of COVID 19. If you develop any symptoms (ie: fever, flu-like symptoms, shortness of breath, cough etc.) before then, please call [phone].  If you test positive for Covid 19 in the 2 weeks post procedure, please call and report this information to [place].    If any biopsies were taken you will be contacted by phone or by letter within the next 1-3 weeks.  Please call us at [phone] if you have not heard about the biopsies in 3 weeks.    SIGNATURES/CONFIDENTIALITY: You and/or your care partner have signed paperwork which will be entered into your electronic medical record.  These signatures attest to the fact that that the information above on your After Visit Summary has been reviewed and is understood.  Full responsibility of the confidentiality of this discharge information lies with you and/or your care-partner.  

## 2021-02-20 NOTE — Progress Notes (Signed)
EGD was completed first.  Per Dr. Loletha Carrow there was food in his stomach.  He did not do the colonoscopy due to risk for aspiration..  Dr. Loletha Carrow explained this to pt and his care partner.  The office will call pt and schedule an esophageal manometry and a gastric emptying study.  No problems were noted in the recovery room. maw

## 2021-02-20 NOTE — Progress Notes (Signed)
pt tolerated well. VSS. awake and to recovery. Report given to RN.  

## 2021-02-20 NOTE — Progress Notes (Signed)
Pt drank water up until 11:00 am,CRNA, Earnestine Mealing notified

## 2021-02-20 NOTE — Progress Notes (Signed)
Vs by DT in adm 

## 2021-02-20 NOTE — Op Note (Signed)
[place] Patient Name: Matthew Roberts Procedure Date: 02/20/2021 1:16 PM MRN: [id number] Endoscopist: Mallie Mussel L. Loletha Carrow , MD Age: 60 Referring MD:  Date of Birth: 1961/01/03 Gender: Male Account #: [id number] Procedure:                Upper GI endoscopy Indications:              Esophageal dysphagia                           Patient had dysphagia in 2008, limited records                            suggest concern for achalasia, had unknown surgery                            (? myotomy, fundoplication, hernia repair)                           Ongoing dysphagia in the years since then.                           recent UGIS shows esophageal dysmotility, mild                            distal tapering, normal passage of tablet Medicines:                Monitored Anesthesia Care Procedure:                Pre-Anesthesia Assessment:                           - Prior to the procedure, a History and Physical                            was performed, and patient medications and                            allergies were reviewed. The patient's tolerance of                            previous anesthesia was also reviewed. The risks                            and benefits of the procedure and the sedation                            options and risks were discussed with the patient.                            All questions were answered, and informed consent                            was obtained. Prior Anticoagulants: The patient has  taken no previous anticoagulant or antiplatelet                            agents. ASA Grade Assessment: III - A patient with                            severe systemic disease. After reviewing the risks                            and benefits, the patient was deemed in                            satisfactory condition to undergo the procedure.                           After obtaining informed consent, the endoscope was                             passed under direct vision. Throughout the                            procedure, the patient's blood pressure, pulse, and                            oxygen saturations were monitored continuously. The                            GIF [id number] #[id number] was introduced through the                            mouth, and advanced to the second part of duodenum.                            The upper GI endoscopy was accomplished without                            difficulty. The patient tolerated the procedure                            well. Scope In: Scope Out: Findings:                 LA Grade B (one or more mucosal breaks greater than                            5 mm, not extending between the tops of two mucosal                            folds) esophagitis with no bleeding was found at                            the gastroesophageal junction.  There is no endoscopic evidence of Barrett's                            esophagus, hiatal hernia, salmon-colored mucosa,                            stricture or areas of dilation in the entire                            esophagus. No significant resistance was                            encountered passing scope through EGJ.                           A medium amount of food (residue) was found in the                            gastric body.                           The exam of the stomach was otherwise normal,                            including on retroflexion (visualization limited by                            retained food). No endoscopic appearance of                            fundoplication.                           The examined duodenum was normal. Complications:            No immediate complications. Estimated Blood Loss:     Estimated blood loss: none. Impression:               - LA Grade B reflux esophagitis with no bleeding.                           - A medium amount of food (residue) in the  stomach.                           - Normal examined duodenum.                           - No specimens collected.                           Patient's reported surgical procedure is presently                            unknown, but does not appear to have been LES  myotomy or fundoplication. Recommendation:           - Patient has a contact number available for                            emergencies. The signs and symptoms of potential                            delayed complications were discussed with the                            patient. Return to normal activities tomorrow.                            Written discharge instructions were provided to the                            patient.                           - Resume previous diet.                           - Continue present medications, including daily                            esomeprazole.                           - Perform routine esophageal manometry at                            appointment to be scheduled.                           - Do a gastric emptying study at appointment to be                            scheduled.                           - Colonoscopy was planned as well but not performed                            due to increased risk of aspiration due to retained                            food in stomach. Will need to be done at a later                            date after further investigation of upper GI issues. Macarius Ruark L. Loletha Carrow, MD 02/20/2021 1:58:59 PM This report has been signed electronically.

## 2021-02-21 ENCOUNTER — Other Ambulatory Visit: Payer: Self-pay

## 2021-02-21 DIAGNOSIS — R1319 Other dysphagia: Secondary | ICD-10-CM

## 2021-02-21 DIAGNOSIS — K3189 Other diseases of stomach and duodenum: Secondary | ICD-10-CM

## 2021-02-21 DIAGNOSIS — K219 Gastro-esophageal reflux disease without esophagitis: Secondary | ICD-10-CM

## 2021-02-21 NOTE — Progress Notes (Signed)
Order in epic for GES, radiology scheduling will contact pt to scheduled the GES. E mano scheduled at [facility] 02/27/21 at 12:30pm. Pt aware of appt and prep instructions sent to pt by mychart.

## 2021-02-22 ENCOUNTER — Telehealth: Payer: Self-pay | Admitting: *Deleted

## 2021-02-22 NOTE — Telephone Encounter (Signed)
  Follow up Call-  Call back number 02/20/2021  Post procedure Call Back phone  # [phone]  Permission to leave phone message Yes  Some recent data might be hidden     Patient questions:  Do you have a fever, pain , or abdominal swelling? No. Pain Score  0 *  Have you tolerated food without any problems? Yes.    Have you been able to return to your normal activities? Yes.    Do you have any questions about your discharge instructions: Diet   No. Medications  No. Follow up visit  No.  Do you have questions or concerns about your Care? No.  Actions: * If pain score is 4 or above: No action needed, pain <4.  Have you developed a fever since your procedure? no  2.   Have you had an respiratory symptoms (SOB or cough) since your procedure? no  3.   Have you tested positive for COVID 19 since your procedure no  4.   Have you had any family members/close contacts diagnosed with the COVID 19 since your procedure?  no   If yes to any of these questions please route to Joylene John, RN and Joella Prince, RN

## 2021-02-27 ENCOUNTER — Ambulatory Visit (HOSPITAL_COMMUNITY)
Admission: RE | Admit: 2021-02-27 | Discharge: 2021-02-27 | Disposition: A | Discharge status: Home / Self Care | Payer: 59 | Source: Ambulatory Visit | Attending: Gastroenterology | Admitting: Gastroenterology

## 2021-02-27 ENCOUNTER — Encounter (HOSPITAL_COMMUNITY)
Admission: RE | Disposition: A | Discharge status: Home / Self Care | Payer: Self-pay | Source: Ambulatory Visit | Attending: Gastroenterology

## 2021-02-27 DIAGNOSIS — Z538 Procedure and treatment not carried out for other reasons: Secondary | ICD-10-CM | POA: Diagnosis not present

## 2021-02-27 DIAGNOSIS — R1310 Dysphagia, unspecified: Secondary | ICD-10-CM | POA: Insufficient documentation

## 2021-02-27 SURGERY — INVASIVE LAB ABORTED CASE

## 2021-02-27 MED ORDER — LIDOCAINE VISCOUS HCL 2 % MT SOLN
OROMUCOSAL | Status: AC
  Filled 2021-02-27: qty 15

## 2021-02-27 SURGICAL SUPPLY — 2 items
FACESHIELD LNG OPTICON STERILE (SAFETY) IMPLANT
GLOVE BIO SURGEON STRL SZ8 (GLOVE) ×6 IMPLANT

## 2021-02-27 NOTE — Progress Notes (Signed)
Attempted to placed esophageal manometry probed.  Probe was part way down when patient grabbed probe and pulled it out.  He stated he could not tolerated probe.  He stated this had happened in the past.  Patient did not wish to attempt again.  Let patient know I would notifiy Dr. Loletha Carrow and have his office call to discuss other options.  Patient in agreement.

## 2021-03-04 ENCOUNTER — Encounter: Payer: Self-pay | Admitting: Gastroenterology

## 2021-03-04 ENCOUNTER — Telehealth: Payer: Self-pay | Admitting: Gastroenterology

## 2021-03-04 NOTE — Telephone Encounter (Signed)
Please see prior encounter.  

## 2021-03-04 NOTE — Telephone Encounter (Signed)
Please clarify what you mean by "see prior encounter"  - H. Danis

## 2021-03-04 NOTE — Telephone Encounter (Signed)
This patient has dysphagia and a suspected diagnosis of achalasia based on history and both radiologic and endoscopic findings.  He had been seen at [place] many years ago for this condition, though we have very little in the way of records from that.  And esophageal manometry study was attempted last week, but the patient was reportedly unable to tolerate passage of the probe.  My recommendation to Matthew Roberts is that he be evaluated by the [facility] GI department.  I believe they will be able to do a special type of endoscopy motility study that we do not perform.  They will also be able to provide advanced endoscopic and (if necessary) surgical treatment for this condition.  If he is agreeable to that, please send a referral to Matthew Roberts of the [facility] GI division. (I will send you a separate message with some contact information for that office)  With the referral, please send my 12/18/2020 office note as well as the subsequent barium swallow and upper endoscopy reports.  Also note in the referral that the patient was seen in the past by Matthew Roberts, who I believe is no longer with the [place] department.  I would like to be informed when the patient has an appointment with Matthew Roberts, as I would then contact him directly with some additional clinical details.  - HD

## 2021-03-04 NOTE — Telephone Encounter (Signed)
My apologies, I think was documented on wrong patient.

## 2021-03-05 NOTE — Telephone Encounter (Signed)
Lm on mobile vm for patient to return call.   The number provided [phone]) is for the [facility] GI procedure scheduling line. Will await return call from patient before proceeding with referral.

## 2021-03-05 NOTE — Telephone Encounter (Signed)
Spoke with patient in regards to information and recommendations below. Patient would like to proceed with referral to [facility] GI. Advised patient that they will contact him directly to set up an appt. Advised patient to let us know if he has not heard from [place] within 2 weeks. Patient advised to keep GES appt as scheduled for 03/07/21. Patient verbalized understanding and had no concerns at the end of the call.   Urgent referral, records, demographic and insurance information faxed to Dr. Ivor Messier at [place] (P: [phone], [phone])

## 2021-03-07 ENCOUNTER — Other Ambulatory Visit: Payer: Self-pay

## 2021-03-07 ENCOUNTER — Ambulatory Visit (HOSPITAL_COMMUNITY)
Admission: RE | Admit: 2021-03-07 | Discharge: 2021-03-07 | Disposition: A | Discharge status: Home / Self Care | Payer: 59 | Source: Ambulatory Visit | Attending: Gastroenterology | Admitting: Gastroenterology

## 2021-03-07 DIAGNOSIS — K3189 Other diseases of stomach and duodenum: Secondary | ICD-10-CM | POA: Insufficient documentation

## 2021-03-07 DIAGNOSIS — K219 Gastro-esophageal reflux disease without esophagitis: Secondary | ICD-10-CM | POA: Insufficient documentation

## 2021-03-07 IMAGING — NM NM GASTRIC EMPTYING
10 series · 10 of 10 positions shown · non-contrast
Comparison: None.

CLINICAL DATA: Reflux

EXAM:
NUCLEAR MEDICINE GASTRIC EMPTYING SCAN
TECHNIQUE: After oral ingestion of radiolabeled meal, sequential abdominal
images were obtained for 4 hours. Percentage of activity emptying
the stomach was calculated at 1 hour, 2 hour, 3 hour, and 4 hours.
RADIOPHARMACEUTICALS:  2.0 mCi [Z4] sulfur colloid in standardized
meal

[Series 1: 0 min · 4.14mm/px · 1 of 1 slices shown (1 of 2)]
[im 1/1]
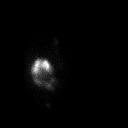

[Series 1: 0 min · 4.14mm/px · 1 of 1 slices shown (2 of 2)]
[im 1/1]
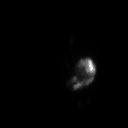

[Series 2: 1 hr · 4.14mm/px · 1 of 1 slices shown (1 of 2)]
[im 1/1]
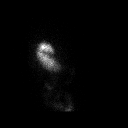

[Series 2: 1 hr · 4.14mm/px · 1 of 1 slices shown (2 of 2)]
[im 1/1]
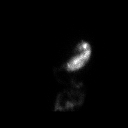

[Series 3: 2 hr · 4.14mm/px · 1 of 1 slices shown (1 of 2)]
[im 1/1]
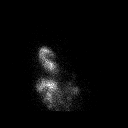

[Series 3: 2 hr · 4.14mm/px · 1 of 1 slices shown (2 of 2)]
[im 1/1]
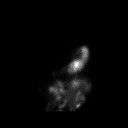

[Series 4: 90 min · 4.14mm/px · 1 of 1 slices shown (1 of 2)]
[im 1/1]
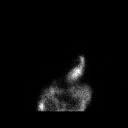

[Series 4: 90 min · 4.14mm/px · 1 of 1 slices shown (2 of 2)]
[im 1/1  full-range]
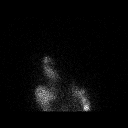

[Series 5: 4 hour · 4.14mm/px · 1 of 1 slices shown (1 of 2)]
[im 1/1]
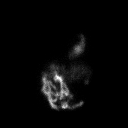

[Series 5: 4 hour · 4.14mm/px · 1 of 1 slices shown (2 of 2)]
[im 1/1  full-range]
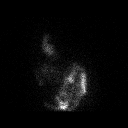

[10 of 10 positions shown; findings below may reference images not displayed]

FINDINGS: Expected location of the stomach in the left upper quadrant.
Ingested meal empties the stomach gradually over the course of the
study.

26% emptied at 1 hr ( normal >= 10%)

49% emptied at 2 hr ( normal >= 40%)

73% emptied at 3 hr ( normal >= 70%)

97% emptied at 4 hr ( normal >= 90%)
IMPRESSION: Normal gastric emptying study.

## 2021-03-07 MED ORDER — TECHNETIUM TC 99M SULFUR COLLOID
2.0000 | Freq: Once | INTRAVENOUS | Status: AC | PRN
  Administered 2021-03-07: 2 via ORAL

## 2021-03-12 ENCOUNTER — Telehealth: Payer: Self-pay | Admitting: Gastroenterology

## 2021-03-12 NOTE — Telephone Encounter (Signed)
Spoke with patient, see 03/07/21 GES result note for more information.

## 2021-03-19 ENCOUNTER — Other Ambulatory Visit: Payer: Self-pay | Admitting: Internal Medicine

## 2021-03-20 ENCOUNTER — Other Ambulatory Visit (HOSPITAL_COMMUNITY): Payer: Self-pay

## 2021-03-20 ENCOUNTER — Telehealth: Payer: Self-pay

## 2021-03-20 MED ORDER — PRAVASTATIN SODIUM 40 MG PO TABS
40.0000 mg | ORAL_TABLET | Freq: Every day | ORAL | 3 refills | Status: DC
  Filled 2021-03-20: qty 90, 90d supply, fill #0
  Filled 2021-07-08: qty 90, 90d supply, fill #1
  Filled 2021-11-05: qty 90, 90d supply, fill #2

## 2021-03-20 NOTE — Telephone Encounter (Signed)
Patient is scheduled for a phone visit with Dr. Ivor Messier on Tuesday, 04/16/21 at 10:50 am.

## 2021-03-20 NOTE — Telephone Encounter (Signed)
-----   Message from Yevette Edwards, RN sent at 03/05/2021 11:39 AM EDT ----- Regarding: [facility] GI appt Follow up on appt with Dr. Ivor Messier at [place], referral faxed on 03/05/21  P: [phone] F: [phone]

## 2021-04-16 DIAGNOSIS — R1310 Dysphagia, unspecified: Secondary | ICD-10-CM | POA: Diagnosis not present

## 2021-04-16 DIAGNOSIS — K220 Achalasia of cardia: Secondary | ICD-10-CM | POA: Diagnosis not present

## 2021-04-23 ENCOUNTER — Other Ambulatory Visit (HOSPITAL_COMMUNITY): Payer: Self-pay

## 2021-04-23 MED FILL — Verapamil HCl Cap ER 24HR 180 MG: ORAL | 90 days supply | Qty: 90 | Fill #1 | Status: AC

## 2021-04-23 MED FILL — Verapamil HCl Cap ER 24HR 180 MG: ORAL | 90 days supply | Qty: 90 | Fill #1 | Status: CN

## 2021-04-24 DIAGNOSIS — K220 Achalasia of cardia: Secondary | ICD-10-CM | POA: Diagnosis not present

## 2021-04-24 DIAGNOSIS — R1310 Dysphagia, unspecified: Secondary | ICD-10-CM | POA: Diagnosis not present

## 2021-04-24 DIAGNOSIS — Z7982 Long term (current) use of aspirin: Secondary | ICD-10-CM | POA: Diagnosis not present

## 2021-04-24 DIAGNOSIS — K2289 Other specified disease of esophagus: Secondary | ICD-10-CM | POA: Diagnosis not present

## 2021-04-24 DIAGNOSIS — I10 Essential (primary) hypertension: Secondary | ICD-10-CM | POA: Diagnosis not present

## 2021-04-24 DIAGNOSIS — K2100 Gastro-esophageal reflux disease with esophagitis, without bleeding: Secondary | ICD-10-CM | POA: Diagnosis not present

## 2021-04-24 DIAGNOSIS — K2270 Barrett's esophagus without dysplasia: Secondary | ICD-10-CM | POA: Diagnosis not present

## 2021-04-24 DIAGNOSIS — F17200 Nicotine dependence, unspecified, uncomplicated: Secondary | ICD-10-CM | POA: Diagnosis not present

## 2021-04-24 DIAGNOSIS — Z79899 Other long term (current) drug therapy: Secondary | ICD-10-CM | POA: Diagnosis not present

## 2021-07-04 DIAGNOSIS — H5203 Hypermetropia, bilateral: Secondary | ICD-10-CM | POA: Diagnosis not present

## 2021-07-04 DIAGNOSIS — H2513 Age-related nuclear cataract, bilateral: Secondary | ICD-10-CM | POA: Diagnosis not present

## 2021-07-04 DIAGNOSIS — H524 Presbyopia: Secondary | ICD-10-CM | POA: Diagnosis not present

## 2021-07-08 ENCOUNTER — Other Ambulatory Visit (HOSPITAL_COMMUNITY): Payer: Self-pay

## 2021-07-30 ENCOUNTER — Other Ambulatory Visit (HOSPITAL_COMMUNITY): Payer: Self-pay

## 2021-07-30 MED FILL — Verapamil HCl Cap ER 24HR 180 MG: ORAL | 90 days supply | Qty: 90 | Fill #2 | Status: AC

## 2021-09-16 ENCOUNTER — Other Ambulatory Visit (HOSPITAL_COMMUNITY): Payer: Self-pay

## 2021-09-16 DIAGNOSIS — M545 Low back pain, unspecified: Secondary | ICD-10-CM | POA: Diagnosis not present

## 2021-09-16 MED ORDER — CYCLOBENZAPRINE HCL 5 MG PO TABS
5.0000 mg | ORAL_TABLET | Freq: Three times a day (TID) | ORAL | 0 refills | Status: AC
  Filled 2021-09-16: qty 42, 14d supply, fill #0

## 2021-09-16 MED ORDER — NAPROXEN 500 MG PO TABS
500.0000 mg | ORAL_TABLET | Freq: Two times a day (BID) | ORAL | 0 refills | Status: AC
  Filled 2021-09-16: qty 28, 14d supply, fill #0

## 2021-11-05 ENCOUNTER — Other Ambulatory Visit: Payer: Self-pay | Admitting: Internal Medicine

## 2021-11-05 ENCOUNTER — Other Ambulatory Visit (HOSPITAL_COMMUNITY): Payer: Self-pay

## 2021-11-06 ENCOUNTER — Other Ambulatory Visit (HOSPITAL_COMMUNITY): Payer: Self-pay

## 2021-11-06 MED ORDER — VERAPAMIL HCL ER 180 MG PO CP24
180.0000 mg | ORAL_CAPSULE | Freq: Every day | ORAL | 0 refills | Status: DC
  Filled 2021-11-06: qty 30, 30d supply, fill #0

## 2021-11-19 ENCOUNTER — Encounter: Payer: 59 | Admitting: Internal Medicine

## 2021-11-29 ENCOUNTER — Ambulatory Visit (INDEPENDENT_AMBULATORY_CARE_PROVIDER_SITE_OTHER): Payer: 59 | Admitting: Internal Medicine

## 2021-11-29 ENCOUNTER — Encounter: Payer: Self-pay | Admitting: Internal Medicine

## 2021-11-29 VITALS — BP 122/80 | HR 64 | Resp 18 | Ht 67.0 in | Wt 153.6 lb

## 2021-11-29 DIAGNOSIS — F17210 Nicotine dependence, cigarettes, uncomplicated: Secondary | ICD-10-CM

## 2021-11-29 DIAGNOSIS — Z0000 Encounter for general adult medical examination without abnormal findings: Secondary | ICD-10-CM | POA: Diagnosis not present

## 2021-11-29 DIAGNOSIS — I10 Essential (primary) hypertension: Secondary | ICD-10-CM | POA: Diagnosis not present

## 2021-11-29 DIAGNOSIS — R739 Hyperglycemia, unspecified: Secondary | ICD-10-CM

## 2021-11-29 DIAGNOSIS — E782 Mixed hyperlipidemia: Secondary | ICD-10-CM | POA: Diagnosis not present

## 2021-11-29 LAB — LIPID PANEL
Cholesterol: 166 mg/dL (ref 0–200)
HDL: 46.7 mg/dL (ref >39.00)
NonHDL: 119.5
Total CHOL/HDL Ratio: 4
Triglycerides: 225 mg/dL — ABNORMAL HIGH (ref 0.0–149.0)
VLDL: 45 mg/dL — ABNORMAL HIGH (ref 0.0–40.0)

## 2021-11-29 LAB — CBC
HCT: 35.1 % — ABNORMAL LOW (ref 39.0–52.0)
Hemoglobin: 11.9 g/dL — ABNORMAL LOW (ref 13.0–17.0)
MCHC: 33.9 g/dL (ref 30.0–36.0)
MCV: 92.4 fl (ref 78.0–100.0)
Platelets: 293 10*3/uL (ref 150.0–400.0)
RBC: 3.79 Mil/uL — ABNORMAL LOW (ref 4.22–5.81)
RDW: 14.8 % (ref 11.5–15.5)
WBC: 7.9 10*3/uL (ref 4.0–10.5)

## 2021-11-29 LAB — COMPREHENSIVE METABOLIC PANEL WITH GFR
ALT: 14 U/L (ref 0–53)
AST: 17 U/L (ref 0–37)
Albumin: 4.4 g/dL (ref 3.5–5.2)
Alkaline Phosphatase: 65 U/L (ref 39–117)
BUN: 15 mg/dL (ref 6–23)
CO2: 28 meq/L (ref 19–32)
Calcium: 9.2 mg/dL (ref 8.4–10.5)
Chloride: 102 meq/L (ref 96–112)
Creatinine, Ser: 1.03 mg/dL (ref 0.40–1.50)
GFR: 77.53 mL/min
Glucose, Bld: 92 mg/dL (ref 70–99)
Potassium: 3.7 meq/L (ref 3.5–5.1)
Sodium: 137 meq/L (ref 135–145)
Total Bilirubin: 0.3 mg/dL (ref 0.2–1.2)
Total Protein: 7.4 g/dL (ref 6.0–8.3)

## 2021-11-29 LAB — HEMOGLOBIN A1C: Hgb A1c MFr Bld: 5.9 % (ref 4.6–6.5)

## 2021-11-29 LAB — LDL CHOLESTEROL, DIRECT: Direct LDL: 101 mg/dL

## 2021-11-29 NOTE — Assessment & Plan Note (Signed)
He is working on cutting back. Reminded about harm from smoking.  ?

## 2021-11-29 NOTE — Assessment & Plan Note (Signed)
Checking HgA1c and adjust as needed.  

## 2021-11-29 NOTE — Progress Notes (Signed)
? ?  Subjective:  ? ?Patient ID: Matthew Roberts, male    DOB: 03/17/1961, 61 y.o.   MRN: [id number] ? ?HPI ?The patient is here for physical. ? ?PMH, [facility], social history reviewed and updated ? ?Review of Systems  ?Constitutional: Negative.   ?HENT: Negative.    ?Eyes: Negative.   ?Respiratory:  Negative for cough, chest tightness and shortness of breath.   ?Cardiovascular:  Negative for chest pain, palpitations and leg swelling.  ?Gastrointestinal:  Negative for abdominal distention, abdominal pain, constipation, diarrhea, nausea and vomiting.  ?Musculoskeletal: Negative.   ?Skin: Negative.   ?Neurological: Negative.   ?Psychiatric/Behavioral: Negative.    ? ?Objective:  ?Physical Exam ?Constitutional:   ?   Appearance: He is well-developed.  ?HENT:  ?   Head: Normocephalic and atraumatic.  ?Cardiovascular:  ?   Rate and Rhythm: Normal rate and regular rhythm.  ?Pulmonary:  ?   Effort: Pulmonary effort is normal. No respiratory distress.  ?   Breath sounds: Normal breath sounds. No wheezing or rales.  ?Abdominal:  ?   General: Bowel sounds are normal. There is no distension.  ?   Palpations: Abdomen is soft.  ?   Tenderness: There is no abdominal tenderness. There is no rebound.  ?Musculoskeletal:  ?   Cervical back: Normal range of motion.  ?Skin: ?   General: Skin is warm and dry.  ?Neurological:  ?   Mental Status: He is alert and oriented to person, place, and time.  ?   Coordination: Coordination normal.  ? ? ?Vitals:  ? 11/29/21 1525  ?BP: 122/80  ?Pulse: 64  ?Resp: 18  ?SpO2: 96%  ?Weight: 153 lb 9.6 oz (69.7 kg)  ?Height: '5\' 7"'$  (1.702 m)  ? ? ?This visit occurred during the SARS-CoV-2 public health emergency.  Safety protocols were in place, including screening questions prior to the visit, additional usage of staff PPE, and extensive cleaning of exam room while observing appropriate contact time as indicated for disinfecting solutions.  ? ?Assessment & Plan:  ? ?

## 2021-11-29 NOTE — Assessment & Plan Note (Signed)
Checking lipid panel and adjust pravastatin 40 mg daily as needed.  

## 2021-11-29 NOTE — Patient Instructions (Signed)
Work on stopping smoking and you are due for the colonoscopy. ? ? ?

## 2021-11-29 NOTE — Assessment & Plan Note (Signed)
Checking CMP and adjust verapamil 180 mg daily. BP at goal today. ?

## 2021-11-29 NOTE — Assessment & Plan Note (Signed)
Flu shot up to date. Covid-19 counseled. Shingrix complete. Tetanus up to date. Colonoscopy due advised to schedule. Counseled about sun safety and mole surveillance. Counseled about the dangers of distracted driving. Given 10 year screening recommendations.  ? ?

## 2021-12-17 ENCOUNTER — Other Ambulatory Visit: Payer: Self-pay | Admitting: Internal Medicine

## 2021-12-18 ENCOUNTER — Other Ambulatory Visit (HOSPITAL_COMMUNITY): Payer: Self-pay

## 2021-12-19 ENCOUNTER — Other Ambulatory Visit (HOSPITAL_COMMUNITY): Payer: Self-pay

## 2021-12-19 MED ORDER — VERAPAMIL HCL ER 180 MG PO CP24
180.0000 mg | ORAL_CAPSULE | Freq: Every day | ORAL | 1 refills | Status: DC
  Filled 2021-12-19: qty 90, 90d supply, fill #0
  Filled 2022-03-25: qty 90, 90d supply, fill #1

## 2022-03-25 ENCOUNTER — Other Ambulatory Visit (HOSPITAL_COMMUNITY): Payer: Self-pay

## 2022-03-25 ENCOUNTER — Other Ambulatory Visit: Payer: Self-pay | Admitting: Internal Medicine

## 2022-04-01 ENCOUNTER — Other Ambulatory Visit: Payer: Self-pay | Admitting: Internal Medicine

## 2022-04-01 ENCOUNTER — Other Ambulatory Visit (HOSPITAL_COMMUNITY): Payer: Self-pay

## 2022-04-08 ENCOUNTER — Other Ambulatory Visit (HOSPITAL_COMMUNITY): Payer: Self-pay

## 2022-04-08 MED ORDER — PRAVASTATIN SODIUM 40 MG PO TABS
40.0000 mg | ORAL_TABLET | Freq: Every day | ORAL | 1 refills | Status: DC
  Filled 2022-04-08: qty 90, 90d supply, fill #0
  Filled 2022-09-09: qty 90, 90d supply, fill #1

## 2022-06-16 ENCOUNTER — Encounter: Payer: Self-pay | Admitting: Internal Medicine

## 2022-06-16 ENCOUNTER — Other Ambulatory Visit (HOSPITAL_COMMUNITY): Payer: Self-pay

## 2022-06-16 ENCOUNTER — Ambulatory Visit: Payer: 59 | Admitting: Internal Medicine

## 2022-06-16 VITALS — BP 138/82 | HR 93 | Temp 98.8°F | Ht 67.0 in | Wt 154.0 lb

## 2022-06-16 DIAGNOSIS — M25532 Pain in left wrist: Secondary | ICD-10-CM | POA: Diagnosis not present

## 2022-06-16 MED ORDER — DICLOFENAC SODIUM 1 % EX GEL
2.0000 g | Freq: Four times a day (QID) | CUTANEOUS | 11 refills | Status: DC
  Filled 2022-06-16: qty 100, 13d supply, fill #0

## 2022-06-16 NOTE — Progress Notes (Signed)
   Subjective:   Patient ID: Matthew Roberts, male    DOB: [birth year], 61 y.o.   MRN: [id number]  HPI The patient is a 61 YO coming in for new lump on left wrist.  Review of Systems  Constitutional: Negative.   HENT: Negative.    Eyes: Negative.   Respiratory:  Negative for cough, chest tightness and shortness of breath.   Cardiovascular:  Negative for chest pain, palpitations and leg swelling.  Gastrointestinal:  Negative for abdominal distention, abdominal pain, constipation, diarrhea, nausea and vomiting.  Musculoskeletal: Negative.        Lump left wrist  Skin: Negative.   Neurological: Negative.   Psychiatric/Behavioral: Negative.      Objective:  Physical Exam Constitutional:      Appearance: He is well-developed.  HENT:     Head: Normocephalic and atraumatic.  Cardiovascular:     Rate and Rhythm: Normal rate and regular rhythm.  Pulmonary:     Effort: Pulmonary effort is normal. No respiratory distress.     Breath sounds: Normal breath sounds. No wheezing or rales.  Abdominal:     General: Bowel sounds are normal. There is no distension.     Palpations: Abdomen is soft.     Tenderness: There is no abdominal tenderness. There is no rebound.  Musculoskeletal:     Cervical back: Normal range of motion.     Comments: Increased bony prominence non-tender left wrist radial aspect   Skin:    General: Skin is warm and dry.  Neurological:     Mental Status: He is alert and oriented to person, place, and time.     Coordination: Coordination normal.     Vitals:   06/16/22 1520  BP: 138/82  Pulse: 93  Temp: 98.8 F (37.1 C)  TempSrc: Oral  SpO2: 96%  Weight: 154 lb (69.9 kg)  Height: '5\' 7"'$  (1.702 m)    Assessment & Plan:

## 2022-06-16 NOTE — Assessment & Plan Note (Signed)
Suspect due to bony prominence and rx voltaren gel to use on it as needed. No intervention needed.

## 2022-06-16 NOTE — Patient Instructions (Signed)
We have sent in the voltaren gel to use on the wrist up to 3 times a day as needed.

## 2022-07-03 ENCOUNTER — Other Ambulatory Visit (HOSPITAL_COMMUNITY): Payer: Self-pay

## 2022-07-03 MED ORDER — ACETAMINOPHEN-CODEINE 300-30 MG PO TABS
1.0000 | ORAL_TABLET | ORAL | 0 refills | Status: DC | PRN
  Filled 2022-07-03: qty 10, 2d supply, fill #0

## 2022-07-04 ENCOUNTER — Other Ambulatory Visit (HOSPITAL_COMMUNITY): Payer: Self-pay

## 2022-07-21 ENCOUNTER — Other Ambulatory Visit (HOSPITAL_COMMUNITY): Payer: Self-pay

## 2022-07-21 DIAGNOSIS — J209 Acute bronchitis, unspecified: Secondary | ICD-10-CM | POA: Diagnosis not present

## 2022-07-21 DIAGNOSIS — I10 Essential (primary) hypertension: Secondary | ICD-10-CM | POA: Diagnosis not present

## 2022-07-21 MED ORDER — ALBUTEROL SULFATE HFA 108 (90 BASE) MCG/ACT IN AERS
2.0000 | INHALATION_SPRAY | RESPIRATORY_TRACT | 0 refills | Status: AC | PRN
  Filled 2022-07-21: qty 6.7, 17d supply, fill #0

## 2022-07-21 MED ORDER — AZITHROMYCIN 250 MG PO TABS
ORAL_TABLET | ORAL | 0 refills | Status: AC
  Filled 2022-07-21: qty 6, 5d supply, fill #0

## 2022-07-21 MED ORDER — METHYLPREDNISOLONE 4 MG PO TBPK
ORAL_TABLET | ORAL | 0 refills | Status: DC
  Filled 2022-07-21: qty 21, 6d supply, fill #0

## 2022-07-31 DIAGNOSIS — H2513 Age-related nuclear cataract, bilateral: Secondary | ICD-10-CM | POA: Diagnosis not present

## 2022-07-31 DIAGNOSIS — H5203 Hypermetropia, bilateral: Secondary | ICD-10-CM | POA: Diagnosis not present

## 2022-07-31 DIAGNOSIS — H524 Presbyopia: Secondary | ICD-10-CM | POA: Diagnosis not present

## 2022-09-09 ENCOUNTER — Other Ambulatory Visit: Payer: Self-pay | Admitting: Internal Medicine

## 2022-09-09 ENCOUNTER — Other Ambulatory Visit (HOSPITAL_COMMUNITY): Payer: Self-pay

## 2022-09-09 MED ORDER — VERAPAMIL HCL ER 180 MG PO CP24
180.0000 mg | ORAL_CAPSULE | Freq: Every day | ORAL | 0 refills | Status: DC
  Filled 2022-09-09: qty 90, 90d supply, fill #0

## 2022-09-10 ENCOUNTER — Other Ambulatory Visit (HOSPITAL_COMMUNITY): Payer: Self-pay

## 2022-10-13 ENCOUNTER — Encounter: Payer: Self-pay | Admitting: Internal Medicine

## 2022-10-16 ENCOUNTER — Other Ambulatory Visit (HOSPITAL_COMMUNITY): Payer: Self-pay

## 2022-10-16 MED ORDER — TADALAFIL 20 MG PO TABS
20.0000 mg | ORAL_TABLET | Freq: Every day | ORAL | 11 refills | Status: DC | PRN
Start: 2022-10-16 — End: 2024-04-11
  Filled 2022-10-16: qty 30, 30d supply, fill #0

## 2023-02-09 ENCOUNTER — Other Ambulatory Visit: Payer: Self-pay

## 2023-02-09 ENCOUNTER — Other Ambulatory Visit: Payer: Self-pay | Admitting: Internal Medicine

## 2023-02-09 ENCOUNTER — Other Ambulatory Visit (HOSPITAL_COMMUNITY): Payer: Self-pay

## 2023-02-09 MED ORDER — PRAVASTATIN SODIUM 40 MG PO TABS
40.0000 mg | ORAL_TABLET | Freq: Every day | ORAL | 1 refills | Status: DC
  Filled 2023-02-09: qty 90, 90d supply, fill #0

## 2023-02-09 MED ORDER — VERAPAMIL HCL ER 180 MG PO CP24
180.0000 mg | ORAL_CAPSULE | Freq: Every day | ORAL | 0 refills | Status: DC
  Filled 2023-02-09: qty 90, 90d supply, fill #0

## 2023-03-02 ENCOUNTER — Encounter: Payer: Self-pay | Admitting: Family Medicine

## 2023-03-02 ENCOUNTER — Other Ambulatory Visit (HOSPITAL_COMMUNITY)
Admission: RE | Admit: 2023-03-02 | Discharge: 2023-03-02 | Disposition: A | Discharge status: Home / Self Care | Payer: Commercial Managed Care - PPO | Source: Ambulatory Visit | Attending: Family Medicine | Admitting: Family Medicine

## 2023-03-02 ENCOUNTER — Ambulatory Visit: Payer: Commercial Managed Care - PPO | Admitting: Family Medicine

## 2023-03-02 ENCOUNTER — Other Ambulatory Visit (HOSPITAL_COMMUNITY): Payer: Self-pay

## 2023-03-02 VITALS — BP 158/88 | HR 90 | Temp 98.7°F | Resp 20 | Ht 67.0 in | Wt 153.0 lb

## 2023-03-02 DIAGNOSIS — L309 Dermatitis, unspecified: Secondary | ICD-10-CM

## 2023-03-02 DIAGNOSIS — A599 Trichomoniasis, unspecified: Secondary | ICD-10-CM | POA: Diagnosis not present

## 2023-03-02 DIAGNOSIS — Z113 Encounter for screening for infections with a predominantly sexual mode of transmission: Secondary | ICD-10-CM

## 2023-03-02 DIAGNOSIS — B372 Candidiasis of skin and nail: Secondary | ICD-10-CM | POA: Diagnosis not present

## 2023-03-02 MED ORDER — TRIAMCINOLONE ACETONIDE 0.1 % EX CREA
1.0000 | TOPICAL_CREAM | Freq: Two times a day (BID) | CUTANEOUS | 0 refills | Status: DC
Start: 2023-03-02 — End: 2023-03-17
  Filled 2023-03-02: qty 80, 30d supply, fill #0

## 2023-03-02 NOTE — Progress Notes (Signed)
Assessment & Plan:  1. Screening examination for STI Advised abstinence until both he and his partner are treated.  - Urine cytology ancillary only (GC/chlamydia, trichomoniasis) - Hepatitis B surface antigen - HIV Antibody (routine testing w rflx) - RPR  2. Candidal dermatitis Continue using Lotrisone cream since it is resolving rash.  3. Dermatitis - triamcinolone cream (KENALOG) 0.1 %; Apply 1 Application topically 2 (two) times daily.  Dispense: 80 g; Refill: 0   Follow up plan: Return if symptoms worsen or fail to improve.  Deliah Boston, MSN, APRN, FNP-C  Subjective:  HPI: Matthew Roberts is a 62 y.o. male presenting on 03/02/2023 for Exposure to STD (Unsure about exposure, does have some red spots that are new, would like to be tested today )  Patient presents for sexually transmitted infection check. STI exposure: current sexual partner is being treated for a sexual transmitted infection, but he is uncertain which one.  Previous history of STI:  none. Current symptoms include red spots on his thighs/groin folds. He first noticed the spots a few weeks ago reports they do itch.  He has been applying Lotrisone cream which is improving the area.  Denies any fever, dysuria, and discharge.    ROS: Negative unless specifically indicated above in HPI.   Relevant past medical history reviewed and updated as indicated.   Allergies and medications reviewed and updated.   Current Outpatient Medications:    albuterol (VENTOLIN HFA) 108 (90 Base) MCG/ACT inhaler, Inhale 2 puffs into the lungs every 4 (four) hours as needed., Disp: 6.7 g, Rfl: 0   cyclobenzaprine (FLEXERIL) 5 MG tablet, Take 1 tablet (5 mg total) by mouth 3 (three) times daily., Disp: 42 tablet, Rfl: 0   diclofenac Sodium (VOLTAREN) 1 % GEL, Apply 2 g topically 4 (four) times daily., Disp: 100 g, Rfl: 11   esomeprazole (NEXIUM) 40 MG capsule, TAKE 1 CAPSULE (40 MG TOTAL) BY MOUTH DAILY., Disp: 90 capsule, Rfl:  3   naproxen (NAPROSYN) 500 MG tablet, Take 1 tablet (500 mg total) by mouth 2 (two) times daily., Disp: 28 tablet, Rfl: 0   pravastatin (PRAVACHOL) 40 MG tablet, Take 1 tablet (40 mg total) by mouth daily., Disp: 90 tablet, Rfl: 1   tadalafil (CIALIS) 20 MG tablet, Take 1 tablet (20 mg total) by mouth daily as needed for erectile dysfunction., Disp: 30 tablet, Rfl: 11   verapamil (VERELAN) 180 MG 24 hr capsule, Take 1 capsule (180 mg total) by mouth at bedtime. Annual appt due in April must see provider for future refills, Disp: 90 capsule, Rfl: 0   acetaminophen-codeine (TYLENOL #3) 300-30 MG tablet, Take 1 tablet by mouth every 4-6 hours as needed. (Patient not taking: Reported on 03/02/2023), Disp: 10 tablet, Rfl: 0  No Known Allergies  Objective:   BP (!) 158/88   Pulse 90   Temp 98.7 F (37.1 C)   Resp 20   Ht 5\' 7"  (1.702 m)   Wt 153 lb (69.4 kg)   BMI 23.96 kg/m    Physical Exam Vitals reviewed. Exam conducted with a chaperone present (J. Bullins).  Constitutional:      General: He is not in acute distress.    Appearance: Normal appearance. He is not ill-appearing, toxic-appearing or diaphoretic.  HENT:     Head: Normocephalic and atraumatic.  Eyes:     General: No scleral icterus.       Right eye: No discharge.        Left eye:  No discharge.     Conjunctiva/sclera: Conjunctivae normal.  Cardiovascular:     Rate and Rhythm: Normal rate.  Pulmonary:     Effort: Pulmonary effort is normal. No respiratory distress.  Genitourinary:    Pubic Area: No rash.      Penis: Normal.   Musculoskeletal:        General: Normal range of motion.     Cervical back: Normal range of motion.  Skin:    General: Skin is warm and dry.     Findings: Rash present.       Neurological:     Mental Status: He is alert and oriented to person, place, and time. Mental status is at baseline.  Psychiatric:        Mood and Affect: Mood normal.        Behavior: Behavior normal.        Thought  Content: Thought content normal.        Judgment: Judgment normal.

## 2023-03-04 ENCOUNTER — Other Ambulatory Visit (HOSPITAL_COMMUNITY): Payer: Self-pay

## 2023-03-04 ENCOUNTER — Encounter (INDEPENDENT_AMBULATORY_CARE_PROVIDER_SITE_OTHER): Payer: Self-pay

## 2023-03-04 MED ORDER — METRONIDAZOLE 500 MG PO TABS
2000.0000 mg | ORAL_TABLET | Freq: Once | ORAL | 0 refills | Status: AC
Start: 2023-03-04 — End: 2023-03-05
  Filled 2023-03-04: qty 4, 1d supply, fill #0

## 2023-03-04 NOTE — Addendum Note (Signed)
Addended by: Gwenlyn Fudge on: 03/04/2023 02:36 PM   Modules accepted: Orders

## 2023-03-09 ENCOUNTER — Ambulatory Visit: Payer: Commercial Managed Care - PPO | Admitting: Family Medicine

## 2023-03-09 ENCOUNTER — Encounter: Payer: Self-pay | Admitting: Family Medicine

## 2023-03-09 ENCOUNTER — Other Ambulatory Visit (HOSPITAL_COMMUNITY)
Admission: RE | Admit: 2023-03-09 | Discharge: 2023-03-09 | Disposition: A | Discharge status: Home / Self Care | Payer: Commercial Managed Care - PPO | Source: Ambulatory Visit | Attending: Family Medicine | Admitting: Family Medicine

## 2023-03-09 VITALS — BP 146/84 | HR 80 | Temp 98.2°F | Resp 20 | Ht 67.0 in | Wt 153.0 lb

## 2023-03-09 DIAGNOSIS — Z8619 Personal history of other infectious and parasitic diseases: Secondary | ICD-10-CM | POA: Insufficient documentation

## 2023-03-09 DIAGNOSIS — I10 Essential (primary) hypertension: Secondary | ICD-10-CM | POA: Diagnosis not present

## 2023-03-09 NOTE — Progress Notes (Signed)
Assessment & Plan:  1. History of trichomoniasis - Urine cytology ancillary only  2. Essential hypertension, benign Uncontrolled.  Encourage patient to monitor his blood pressure, keep a log, and bring it with him to his follow-up appointment with his PCP.  Education provided on the DASH diet.   Follow up plan: Return for as scheduled with PCP.  Deliah Boston, MSN, APRN, FNP-C  Subjective:  HPI: Matthew Roberts is a 62 y.o. male presenting on 03/09/2023 for Rash (Follow up - 1 week //Also f/u STI )  Patient is here for test of cure.  He was treated for trichomoniasis 5 days ago.   ROS: Negative unless specifically indicated above in HPI.   Relevant past medical history reviewed and updated as indicated.   Allergies and medications reviewed and updated.   Current Outpatient Medications:    acetaminophen-codeine (TYLENOL #3) 300-30 MG tablet, Take 1 tablet by mouth every 4-6 hours as needed., Disp: 10 tablet, Rfl: 0   albuterol (VENTOLIN HFA) 108 (90 Base) MCG/ACT inhaler, Inhale 2 puffs into the lungs every 4 (four) hours as needed., Disp: 6.7 g, Rfl: 0   cyclobenzaprine (FLEXERIL) 5 MG tablet, Take 1 tablet (5 mg total) by mouth 3 (three) times daily., Disp: 42 tablet, Rfl: 0   diclofenac Sodium (VOLTAREN) 1 % GEL, Apply 2 g topically 4 (four) times daily., Disp: 100 g, Rfl: 11   naproxen (NAPROSYN) 500 MG tablet, Take 1 tablet (500 mg total) by mouth 2 (two) times daily., Disp: 28 tablet, Rfl: 0   pravastatin (PRAVACHOL) 40 MG tablet, Take 1 tablet (40 mg total) by mouth daily., Disp: 90 tablet, Rfl: 1   tadalafil (CIALIS) 20 MG tablet, Take 1 tablet (20 mg total) by mouth daily as needed for erectile dysfunction., Disp: 30 tablet, Rfl: 11   triamcinolone cream (KENALOG) 0.1 %, Apply 1 Application topically 2 (two) times daily., Disp: 80 g, Rfl: 0   verapamil (VERELAN) 180 MG 24 hr capsule, Take 1 capsule (180 mg total) by mouth at bedtime. Annual appt due in April must see  provider for future refills, Disp: 90 capsule, Rfl: 0   esomeprazole (NEXIUM) 40 MG capsule, TAKE 1 CAPSULE (40 MG TOTAL) BY MOUTH DAILY., Disp: 90 capsule, Rfl: 3  No Known Allergies  Objective:   BP (!) 146/84   Pulse 80   Temp 98.2 F (36.8 C)   Resp 20   Ht 5\' 7"  (1.702 m)   Wt 153 lb (69.4 kg)   BMI 23.96 kg/m    Physical Exam Vitals reviewed.  Constitutional:      General: He is not in acute distress.    Appearance: Normal appearance. He is not ill-appearing, toxic-appearing or diaphoretic.  HENT:     Head: Normocephalic and atraumatic.  Eyes:     General: No scleral icterus.       Right eye: No discharge.        Left eye: No discharge.     Conjunctiva/sclera: Conjunctivae normal.  Cardiovascular:     Rate and Rhythm: Normal rate.  Pulmonary:     Effort: Pulmonary effort is normal. No respiratory distress.  Musculoskeletal:        General: Normal range of motion.     Cervical back: Normal range of motion.  Skin:    General: Skin is warm and dry.  Neurological:     Mental Status: He is alert and oriented to person, place, and time. Mental status is at baseline.  Psychiatric:  Mood and Affect: Mood normal.        Behavior: Behavior normal.        Thought Content: Thought content normal.        Judgment: Judgment normal.

## 2023-03-17 ENCOUNTER — Other Ambulatory Visit (HOSPITAL_COMMUNITY): Payer: Self-pay

## 2023-03-17 ENCOUNTER — Encounter: Payer: Self-pay | Admitting: Internal Medicine

## 2023-03-17 ENCOUNTER — Ambulatory Visit (INDEPENDENT_AMBULATORY_CARE_PROVIDER_SITE_OTHER): Payer: Commercial Managed Care - PPO | Admitting: Internal Medicine

## 2023-03-17 VITALS — BP 160/100 | HR 77 | Temp 98.3°F | Ht 67.0 in | Wt 150.0 lb

## 2023-03-17 DIAGNOSIS — F17210 Nicotine dependence, cigarettes, uncomplicated: Secondary | ICD-10-CM

## 2023-03-17 DIAGNOSIS — R739 Hyperglycemia, unspecified: Secondary | ICD-10-CM

## 2023-03-17 DIAGNOSIS — E782 Mixed hyperlipidemia: Secondary | ICD-10-CM | POA: Diagnosis not present

## 2023-03-17 DIAGNOSIS — Z23 Encounter for immunization: Secondary | ICD-10-CM

## 2023-03-17 DIAGNOSIS — I10 Essential (primary) hypertension: Secondary | ICD-10-CM

## 2023-03-17 DIAGNOSIS — Z0000 Encounter for general adult medical examination without abnormal findings: Secondary | ICD-10-CM | POA: Diagnosis not present

## 2023-03-17 LAB — COMPREHENSIVE METABOLIC PANEL
ALT: 14 U/L (ref 0–53)
AST: 16 U/L (ref 0–37)
Albumin: 4.5 g/dL (ref 3.5–5.2)
Alkaline Phosphatase: 53 U/L (ref 39–117)
BUN: 13 mg/dL (ref 6–23)
CO2: 29 mEq/L (ref 19–32)
Calcium: 9.5 mg/dL (ref 8.4–10.5)
Chloride: 105 mEq/L (ref 96–112)
Creatinine, Ser: 0.97 mg/dL (ref 0.40–1.50)
GFR: 82.56 mL/min (ref >60.00)
Glucose, Bld: 89 mg/dL (ref 70–99)
Potassium: 4 mEq/L (ref 3.5–5.1)
Sodium: 142 mEq/L (ref 135–145)
Total Bilirubin: 0.4 mg/dL (ref 0.2–1.2)
Total Protein: 7.6 g/dL (ref 6.0–8.3)

## 2023-03-17 LAB — LIPID PANEL
Cholesterol: 157 mg/dL (ref 0–200)
HDL: 44.1 mg/dL (ref >39.00)
LDL Cholesterol: 92 mg/dL (ref 0–99)
NonHDL: 112.92
Total CHOL/HDL Ratio: 4
Triglycerides: 106 mg/dL (ref 0.0–149.0)
VLDL: 21.2 mg/dL (ref 0.0–40.0)

## 2023-03-17 LAB — CBC
HCT: 38.5 % — ABNORMAL LOW (ref 39.0–52.0)
Hemoglobin: 12.6 g/dL — ABNORMAL LOW (ref 13.0–17.0)
MCHC: 32.8 g/dL (ref 30.0–36.0)
MCV: 92.6 fl (ref 78.0–100.0)
Platelets: 304 10*3/uL (ref 150.0–400.0)
RBC: 4.16 Mil/uL — ABNORMAL LOW (ref 4.22–5.81)
RDW: 14.9 % (ref 11.5–15.5)
WBC: 7.3 10*3/uL (ref 4.0–10.5)

## 2023-03-17 LAB — HEMOGLOBIN A1C: Hgb A1c MFr Bld: 6.2 % (ref 4.6–6.5)

## 2023-03-17 MED ORDER — VERAPAMIL HCL ER 240 MG PO CP24
240.0000 mg | ORAL_CAPSULE | Freq: Every day | ORAL | 3 refills | Status: DC
Start: 2023-03-17 — End: 2024-04-11
  Filled 2023-03-17 – 2023-03-27 (×3): qty 90, 90d supply, fill #0
  Filled 2023-07-08: qty 90, 90d supply, fill #1
  Filled 2023-10-28: qty 90, 90d supply, fill #2
  Filled 2023-10-28: qty 80, 80d supply, fill #2
  Filled 2023-10-28: qty 10, 10d supply, fill #2
  Filled 2024-02-04: qty 90, 90d supply, fill #3

## 2023-03-17 MED ORDER — DICLOFENAC SODIUM 1 % EX GEL
2.0000 g | Freq: Four times a day (QID) | CUTANEOUS | 11 refills | Status: DC
  Filled 2023-03-17: qty 100, 13d supply, fill #0

## 2023-03-17 MED ORDER — CLOTRIMAZOLE-BETAMETHASONE 1-0.05 % EX CREA
1.0000 | TOPICAL_CREAM | Freq: Every day | CUTANEOUS | 0 refills | Status: DC
  Filled 2023-03-17: qty 30, 10d supply, fill #0

## 2023-03-17 MED ORDER — PRAVASTATIN SODIUM 40 MG PO TABS
40.0000 mg | ORAL_TABLET | Freq: Every day | ORAL | 3 refills | Status: DC
  Filled 2023-03-17 – 2023-05-29 (×3): qty 90, 90d supply, fill #0
  Filled 2023-09-07: qty 90, 90d supply, fill #1
  Filled 2023-12-17: qty 90, 90d supply, fill #2

## 2023-03-17 NOTE — Progress Notes (Signed)
   Subjective:   Patient ID: Matthew Roberts, male    DOB: 1960-12-11, 62 y.o.   MRN: [id number]  HPI The patient is here for physical.  PMH, [facility], social history reviewed and updated  Review of Systems  Constitutional: Negative.   HENT: Negative.    Eyes: Negative.   Respiratory:  Negative for cough, chest tightness and shortness of breath.   Cardiovascular:  Negative for chest pain, palpitations and leg swelling.  Gastrointestinal:  Negative for abdominal distention, abdominal pain, constipation, diarrhea, nausea and vomiting.  Musculoskeletal: Negative.   Skin: Negative.   Neurological: Negative.   Psychiatric/Behavioral: Negative.      Objective:  Physical Exam Constitutional:      Appearance: He is well-developed.  HENT:     Head: Normocephalic and atraumatic.  Cardiovascular:     Rate and Rhythm: Normal rate and regular rhythm.  Pulmonary:     Effort: Pulmonary effort is normal. No respiratory distress.     Breath sounds: Normal breath sounds. No wheezing or rales.  Abdominal:     General: Bowel sounds are normal. There is no distension.     Palpations: Abdomen is soft.     Tenderness: There is no abdominal tenderness. There is no rebound.  Musculoskeletal:     Cervical back: Normal range of motion.  Skin:    General: Skin is warm and dry.  Neurological:     Mental Status: He is alert and oriented to person, place, and time.     Coordination: Coordination normal.     Vitals:   03/17/23 1541 03/17/23 1543  BP: (!) 160/100 (!) 160/100  Pulse: 77   Temp: 98.3 F (36.8 C)   TempSrc: Oral   SpO2: 99%   Weight: 150 lb (68 kg)   Height: 5\' 7"  (1.702 m)     Assessment & Plan:  Tdap given at visit

## 2023-03-17 NOTE — Patient Instructions (Addendum)
Let us know when you are ready for the colonoscopy.  Check the blood pressure about 1-2 times a  month and let us know. The goal is <140/90

## 2023-03-17 NOTE — Assessment & Plan Note (Signed)
Advised to quit and he will try nicotine patches. Reminded about harm/risk from smoking.

## 2023-03-17 NOTE — Assessment & Plan Note (Signed)
Checking lipid panel and adjust pravastatin 40 mg daily as needed.  

## 2023-03-17 NOTE — Assessment & Plan Note (Signed)
Checking HgA1c and adjust as needed.  

## 2023-03-17 NOTE — Assessment & Plan Note (Signed)
Increasing verapamil to 240 mg daily to help with BP control. Monitor BP in 2-3 weeks and let us know.

## 2023-03-17 NOTE — Assessment & Plan Note (Signed)
Flu shot yearly. Shingrix complete. Tetanus given. Colonoscopy due he will think about soon declines referral today. Counseled about sun safety and mole surveillance. Counseled about the dangers of distracted driving. Given 10 year screening recommendations.

## 2023-03-18 ENCOUNTER — Other Ambulatory Visit (HOSPITAL_COMMUNITY): Payer: Self-pay

## 2023-03-25 ENCOUNTER — Telehealth: Payer: Self-pay | Admitting: Internal Medicine

## 2023-03-25 NOTE — Telephone Encounter (Signed)
Patient returned Matthew Roberts's call about his lab results and would like a call back at [phone].

## 2023-03-27 ENCOUNTER — Other Ambulatory Visit (HOSPITAL_COMMUNITY): Payer: Self-pay

## 2023-03-30 ENCOUNTER — Other Ambulatory Visit (HOSPITAL_COMMUNITY): Payer: Self-pay

## 2023-05-29 ENCOUNTER — Other Ambulatory Visit (HOSPITAL_COMMUNITY): Payer: Self-pay

## 2023-10-06 DIAGNOSIS — H5203 Hypermetropia, bilateral: Secondary | ICD-10-CM | POA: Diagnosis not present

## 2023-10-20 DIAGNOSIS — L820 Inflamed seborrheic keratosis: Secondary | ICD-10-CM | POA: Diagnosis not present

## 2023-10-28 ENCOUNTER — Other Ambulatory Visit (HOSPITAL_COMMUNITY): Payer: Self-pay

## 2023-12-01 DIAGNOSIS — L438 Other lichen planus: Secondary | ICD-10-CM | POA: Diagnosis not present

## 2023-12-02 ENCOUNTER — Other Ambulatory Visit (HOSPITAL_COMMUNITY): Payer: Self-pay

## 2023-12-02 MED ORDER — CLOBETASOL PROPIONATE 0.05 % EX CREA
1.0000 | TOPICAL_CREAM | Freq: Two times a day (BID) | CUTANEOUS | 3 refills | Status: AC | PRN
  Filled 2023-12-02: qty 60, 30d supply, fill #0
  Filled 2024-03-01: qty 60, 30d supply, fill #1

## 2024-02-03 ENCOUNTER — Other Ambulatory Visit (HOSPITAL_COMMUNITY): Payer: Self-pay

## 2024-02-03 DIAGNOSIS — L438 Other lichen planus: Secondary | ICD-10-CM | POA: Diagnosis not present

## 2024-02-03 DIAGNOSIS — B355 Tinea imbricata: Secondary | ICD-10-CM | POA: Diagnosis not present

## 2024-02-03 MED ORDER — HALOBETASOL PROPIONATE 0.05 % EX CREA
1.0000 | TOPICAL_CREAM | Freq: Two times a day (BID) | CUTANEOUS | 2 refills | Status: AC
  Filled 2024-02-03: qty 50, 30d supply, fill #0

## 2024-02-03 MED ORDER — TERBINAFINE HCL 250 MG PO TABS
250.0000 mg | ORAL_TABLET | Freq: Every day | ORAL | 1 refills | Status: AC
  Filled 2024-02-03: qty 21, 21d supply, fill #0
  Filled 2024-06-28: qty 21, 21d supply, fill #1

## 2024-02-04 ENCOUNTER — Other Ambulatory Visit: Payer: Self-pay

## 2024-02-04 ENCOUNTER — Other Ambulatory Visit (HOSPITAL_COMMUNITY): Payer: Self-pay

## 2024-02-05 ENCOUNTER — Other Ambulatory Visit (HOSPITAL_COMMUNITY): Payer: Self-pay

## 2024-02-10 ENCOUNTER — Other Ambulatory Visit (HOSPITAL_COMMUNITY): Payer: Self-pay

## 2024-03-24 ENCOUNTER — Other Ambulatory Visit (HOSPITAL_COMMUNITY): Payer: Self-pay

## 2024-03-24 ENCOUNTER — Other Ambulatory Visit: Payer: Self-pay | Admitting: Internal Medicine

## 2024-03-24 ENCOUNTER — Encounter (HOSPITAL_COMMUNITY): Payer: Self-pay

## 2024-04-11 ENCOUNTER — Ambulatory Visit (INDEPENDENT_AMBULATORY_CARE_PROVIDER_SITE_OTHER): Admitting: Internal Medicine

## 2024-04-11 ENCOUNTER — Other Ambulatory Visit (HOSPITAL_COMMUNITY): Payer: Self-pay

## 2024-04-11 ENCOUNTER — Encounter: Payer: Self-pay | Admitting: Internal Medicine

## 2024-04-11 VITALS — BP 140/100 | HR 85 | Temp 98.7°F | Ht 67.0 in | Wt 153.0 lb

## 2024-04-11 DIAGNOSIS — F17210 Nicotine dependence, cigarettes, uncomplicated: Secondary | ICD-10-CM | POA: Diagnosis not present

## 2024-04-11 DIAGNOSIS — I10 Essential (primary) hypertension: Secondary | ICD-10-CM | POA: Diagnosis not present

## 2024-04-11 DIAGNOSIS — R739 Hyperglycemia, unspecified: Secondary | ICD-10-CM

## 2024-04-11 DIAGNOSIS — E782 Mixed hyperlipidemia: Secondary | ICD-10-CM | POA: Diagnosis not present

## 2024-04-11 DIAGNOSIS — Z0000 Encounter for general adult medical examination without abnormal findings: Secondary | ICD-10-CM | POA: Diagnosis not present

## 2024-04-11 MED ORDER — CLOTRIMAZOLE-BETAMETHASONE 1-0.05 % EX CREA
1.0000 | TOPICAL_CREAM | Freq: Every day | CUTANEOUS | 0 refills | Status: AC
  Filled 2024-04-11: qty 30, 30d supply, fill #0

## 2024-04-11 MED ORDER — TADALAFIL 20 MG PO TABS
20.0000 mg | ORAL_TABLET | Freq: Every day | ORAL | 11 refills | Status: AC | PRN
  Filled 2024-04-11: qty 30, 30d supply, fill #0
  Filled 2024-08-01: qty 30, 30d supply, fill #1

## 2024-04-11 MED ORDER — DICLOFENAC SODIUM 1 % EX GEL
2.0000 g | Freq: Four times a day (QID) | CUTANEOUS | 11 refills | Status: AC
  Filled 2024-04-11: qty 100, 13d supply, fill #0

## 2024-04-11 MED ORDER — VERAPAMIL HCL ER 360 MG PO CP24
360.0000 mg | ORAL_CAPSULE | Freq: Every day | ORAL | 3 refills | Status: AC
  Filled 2024-04-11: qty 90, 90d supply, fill #0
  Filled 2024-08-01: qty 90, 90d supply, fill #1

## 2024-04-11 MED ORDER — PRAVASTATIN SODIUM 40 MG PO TABS
40.0000 mg | ORAL_TABLET | Freq: Every day | ORAL | 3 refills | Status: AC
  Filled 2024-04-11: qty 90, 90d supply, fill #0
  Filled 2024-08-01: qty 90, 90d supply, fill #1

## 2024-04-11 NOTE — Patient Instructions (Addendum)
 We will increase the dose of the verapamil  to 360 mg daily and see you back in 2-3 months to see if this is working.  Work on quitting smoking.

## 2024-04-11 NOTE — Progress Notes (Unsigned)
   Subjective:   Patient ID: Matthew Roberts, male    DOB: [birth year], 63 y.o.   MRN: [id number]  The patient is here for physical. Pertinent topics discussed: Discussed the use of AI scribe software for clinical note transcription with the patient, who gave verbal consent to proceed.  History of Present Illness Matthew Roberts is a 63 year old male with hypertension who presents for a routine follow-up visit.  He experienced stomach soreness about one to two weeks ago, describing it as feeling 'sore inside' with crampy and painful sensations. The discomfort has been gradually improving. No diarrhea, constipation, or blood in stool. 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  He has been smoking and is currently using a nicotine  patch to aid in quitting. He notes a reduction in symptoms such as 'rattling' during sleep since cutting back on smoking.  PMH, [facility], social history reviewed and updated  Review of Systems  Constitutional: Negative.   HENT: Negative.    Eyes: Negative.   Respiratory:  Negative for cough, chest tightness and shortness of breath.   Cardiovascular:  Negative for chest pain, palpitations and leg swelling.  Gastrointestinal:  Negative for abdominal distention, abdominal pain, constipation, diarrhea, nausea and vomiting.  Musculoskeletal: Negative.   Skin: Negative.   Neurological: Negative.   Psychiatric/Behavioral: Negative.      Objective:  Physical Exam Constitutional:      Appearance: He is well-developed.  HENT:     Head: Normocephalic and atraumatic.  Cardiovascular:     Rate and Rhythm: Normal rate and regular rhythm.  Pulmonary:     Effort: Pulmonary effort is normal. No respiratory distress.     Breath sounds: Normal breath sounds. No wheezing or rales.  Abdominal:     General: Bowel sounds are normal. There is no distension.      Palpations: Abdomen is soft.     Tenderness: There is no abdominal tenderness.  Musculoskeletal:     Cervical back: Normal range of motion.  Skin:    General: Skin is warm and dry.  Neurological:     Mental Status: He is alert and oriented to person, place, and time.     Coordination: Coordination normal.     Vitals:   04/11/24 1554  BP: (!) 140/100  Pulse: 85  Temp: 98.7 F (37.1 C)  TempSrc: Oral  SpO2: 99%  Weight: 153 lb (69.4 kg)  Height: 5' 7 (1.702 m)    Assessment & Plan:

## 2024-04-12 ENCOUNTER — Other Ambulatory Visit (HOSPITAL_COMMUNITY): Payer: Self-pay

## 2024-04-12 LAB — LIPID PANEL
Cholesterol: 138 mg/dL (ref 0–200)
HDL: 43.9 mg/dL (ref >39.00)
LDL Cholesterol: 74 mg/dL (ref 0–99)
NonHDL: 94.4
Total CHOL/HDL Ratio: 3
Triglycerides: 103 mg/dL (ref 0.0–149.0)
VLDL: 20.6 mg/dL (ref 0.0–40.0)

## 2024-04-12 LAB — COMPREHENSIVE METABOLIC PANEL WITH GFR
ALT: 16 U/L (ref 0–53)
AST: 17 U/L (ref 0–37)
Albumin: 4.3 g/dL (ref 3.5–5.2)
Alkaline Phosphatase: 44 U/L (ref 39–117)
BUN: 13 mg/dL (ref 6–23)
CO2: 28 meq/L (ref 19–32)
Calcium: 9.4 mg/dL (ref 8.4–10.5)
Chloride: 105 meq/L (ref 96–112)
Creatinine, Ser: 0.88 mg/dL (ref 0.40–1.50)
GFR: 90.26 mL/min (ref >60.00)
Glucose, Bld: 88 mg/dL (ref 70–99)
Potassium: 3.9 meq/L (ref 3.5–5.1)
Sodium: 140 meq/L (ref 135–145)
Total Bilirubin: 0.4 mg/dL (ref 0.2–1.2)
Total Protein: 7.3 g/dL (ref 6.0–8.3)

## 2024-04-12 LAB — CBC
HCT: 35.9 % — ABNORMAL LOW (ref 39.0–52.0)
Hemoglobin: 12.3 g/dL — ABNORMAL LOW (ref 13.0–17.0)
MCHC: 34.3 g/dL (ref 30.0–36.0)
MCV: 90.5 fl (ref 78.0–100.0)
Platelets: 293 K/uL (ref 150.0–400.0)
RBC: 3.97 Mil/uL — ABNORMAL LOW (ref 4.22–5.81)
RDW: 14.7 % (ref 11.5–15.5)
WBC: 7.9 K/uL (ref 4.0–10.5)

## 2024-04-12 LAB — HEMOGLOBIN A1C: Hgb A1c MFr Bld: 6.5 % (ref 4.6–6.5)

## 2024-04-13 ENCOUNTER — Ambulatory Visit: Payer: Self-pay | Admitting: Internal Medicine

## 2024-04-13 DIAGNOSIS — E119 Type 2 diabetes mellitus without complications: Secondary | ICD-10-CM

## 2024-04-13 NOTE — Assessment & Plan Note (Signed)
Checking HgA1c. 

## 2024-04-13 NOTE — Assessment & Plan Note (Signed)
 Checking lipid panel and adjust as needed.

## 2024-04-13 NOTE — Assessment & Plan Note (Signed)
 BP elevated at home and at office. Increase dose of verapamil  to 360 mg daily and new rx done. Checking CMP and adjust as needed.

## 2024-04-13 NOTE — Assessment & Plan Note (Signed)
 Flu shot counseled. Pneumonia counseled. Shingrix  counseled. Tetanus counseled. Colonoscopy referral to gi done. Counseled about sun safety and mole surveillance. Counseled about the dangers of distracted driving. Given 10 year screening recommendations.

## 2024-04-13 NOTE — Assessment & Plan Note (Signed)
 Counseled to quit

## 2024-04-18 DIAGNOSIS — E119 Type 2 diabetes mellitus without complications: Secondary | ICD-10-CM | POA: Insufficient documentation

## 2024-04-18 NOTE — Addendum Note (Signed)
 Addended by: ROLLENE NORRIS A on: 04/18/2024 09:09 AM   Modules accepted: Orders

## 2024-04-22 ENCOUNTER — Telehealth: Payer: Self-pay | Admitting: *Deleted

## 2024-04-22 NOTE — Progress Notes (Signed)
 Care Guide Pharmacy Note  04/22/2024 Name: Matthew Roberts MRN: [id number] DOB: 12/26/1960  Referred By: Rollene Almarie LABOR, MD Reason for referral: Call Attempt #1 and Complex Care Management (Outreach to schedule referral with pharmacist )   Matthew Roberts is a 63 y.o. year old male who is a primary care patient of Rollene Almarie LABOR, MD.  [name] was referred to the pharmacist for assistance related to: DMII  An unsuccessful telephone outreach was attempted today to contact the patient who was referred to the pharmacy team for assistance with medication management. Additional attempts will be made to contact the patient.  [name], CMA [place]  [facility], [facility] Guide Direct Dial: [phone]  Fax: [phone] Website: [email or web address]

## 2024-04-25 NOTE — Progress Notes (Signed)
 Care Guide Pharmacy Note  04/25/2024 Name: Matthew Roberts MRN: [id number] DOB: 07/10/1961  Referred By: Rollene Almarie LABOR, MD Reason for referral: Call Attempt #1 and Complex Care Management (Outreach to schedule referral with pharmacist )   Matthew Roberts is a 63 y.o. year old male who is a primary care patient of Rollene Almarie LABOR, MD.  [name] was referred to the pharmacist for assistance related to: DMII  Successful contact was made with the patient to discuss pharmacy services including being ready for the pharmacist to call at least 5 minutes before the scheduled appointment time and to have medication bottles and any blood pressure readings ready for review. The patient agreed to meet with the pharmacist via telephone visit on 05/05/2024  [name], CMA [place]  [facility], [facility] Guide Direct Dial: [phone]  Fax: [phone] Website: [email or web address]

## 2024-05-05 ENCOUNTER — Other Ambulatory Visit: Admitting: Pharmacist

## 2024-05-05 DIAGNOSIS — E119 Type 2 diabetes mellitus without complications: Secondary | ICD-10-CM

## 2024-05-05 NOTE — Patient Instructions (Signed)
 It was a pleasure speaking with you today!  Focus on diet changes and increasing movement. We will check your A1c again in 6 months.  Feel free to call with any questions or concerns!  Darrelyn Drum, PharmD, BCPS, CPP Clinical Pharmacist Practitioner [facility] [phone]

## 2024-05-05 NOTE — Progress Notes (Signed)
   05/05/2024 Name: Matthew Roberts MRN: [id number] DOB: 08/23/1960  Chief Complaint  Patient presents with   Diabetes   Medication Management    Matthew Roberts is a 63 y.o. year old male who presented for a telephone visit.   They were referred to the pharmacist by their PCP for assistance in managing diabetes.   Subjective:  Care Team: Primary Care Provider: Rollene Almarie LABOR, MD ; Next Scheduled Visit: 10/11/24  Medication Access/Adherence  Current Pharmacy:  JOLYNN PACK - [facility] [address], [address] [place] [address] Phone: [phone] Fax: [phone]  DARRYLE LONG - [facility] [address] [place] [address] Phone: [phone] Fax: [phone]   Patient reports affordability concerns with their medications: No  Patient reports access/transportation concerns to their pharmacy: No  Patient reports adherence concerns with their medications:  No     Diabetes: *New diagnosis  Current medications: none   Current meal patterns: Pt notes he was drinking sodas, juice, and sweet tea regularly as well as eating high sugar foods. He has since switched to zero sugar drinks and more water along with focusing on reducing high sugar foods.   Macrovascular and Microvascular Risk Reduction:  Statin? yes (pravastatin ); ACEi/ARB? no; therapy not indicated  Last urinary albumin/creatinine ratio:   Last eye exam:   Last foot exam: No foot exam found Tobacco Use:  Tobacco Use: High Risk (04/11/2024)   Patient History    Smoking Tobacco Use: Every Day    Smokeless Tobacco Use: Never    Passive Exposure: Not on file     Objective:  Lab Results  Component Value Date   HGBA1C 6.5 04/11/2024    Lab Results  Component Value Date   CREATININE 0.88 04/11/2024   BUN 13 04/11/2024   NA 140 04/11/2024   K 3.9 04/11/2024   CL 105 04/11/2024   CO2 28 04/11/2024    Lab Results  Component Value  Date   CHOL 138 04/11/2024   HDL 43.90 04/11/2024   LDLCALC 74 04/11/2024   LDLDIRECT 101.0 11/29/2021   TRIG 103.0 04/11/2024   CHOLHDL 3 04/11/2024    Medications Reviewed Today   Medications were not reviewed in this encounter       Assessment/Plan:   Diabetes: - Currently controlled; goal A1c <7%. Cardiorenal risk reduction is opportunities for improvement.. Blood pressure is not at goal <130/80. LDL is not at goal.  - Reviewed long term cardiovascular and renal outcomes of uncontrolled blood sugar. and Reviewed dietary modifications including portion sizes of starchy foods, plate method, increased protein and fiber, reducing/avoiding high sugar beverages. - Recommend obtaining UACR - Recommend increasing statin/changing statin to higher intensity if LDL remains >70 - Consider ACE/ARB if BP remains elevated  Follow Up Plan: March A1c at PCP appt  Darrelyn Drum, PharmD, BCPS, CPP Clinical Pharmacist Practitioner [facility] [phone]

## 2024-08-01 ENCOUNTER — Other Ambulatory Visit: Payer: Self-pay

## 2024-08-01 ENCOUNTER — Other Ambulatory Visit (HOSPITAL_COMMUNITY): Payer: Self-pay

## 2024-08-02 ENCOUNTER — Other Ambulatory Visit (HOSPITAL_COMMUNITY): Payer: Self-pay

## 2024-10-11 ENCOUNTER — Ambulatory Visit: Admitting: Internal Medicine
